# Patient Record
Sex: Female | Born: 1975
Health system: Southern US, Community
[De-identification: ages and names within clinical notes are randomized; demographics above are authoritative.]

## PROBLEM LIST (undated history)

## (undated) DIAGNOSIS — I890 Lymphedema, not elsewhere classified: Secondary | ICD-10-CM

## (undated) DIAGNOSIS — Z87442 Personal history of urinary calculi: Secondary | ICD-10-CM

## (undated) DIAGNOSIS — R112 Nausea with vomiting, unspecified: Secondary | ICD-10-CM

## (undated) DIAGNOSIS — Z9889 Other specified postprocedural states: Secondary | ICD-10-CM

## (undated) DIAGNOSIS — R519 Headache, unspecified: Secondary | ICD-10-CM

## (undated) DIAGNOSIS — R51 Headache: Secondary | ICD-10-CM

## (undated) HISTORY — PX: ESCHAROTOMY: SHX248

## (undated) HISTORY — PX: CHOLECYSTECTOMY: SHX55

## (undated) HISTORY — PX: SKIN GRAFT: SHX250

## (undated) HISTORY — PX: ABDOMINAL HYSTERECTOMY: SHX81

---

## 2004-10-21 ENCOUNTER — Ambulatory Visit: Payer: Self-pay | Admitting: Internal Medicine

## 2004-10-24 ENCOUNTER — Ambulatory Visit: Payer: Self-pay | Admitting: Internal Medicine

## 2004-10-24 ENCOUNTER — Ambulatory Visit (HOSPITAL_COMMUNITY): Admission: RE | Admit: 2004-10-24 | Discharge: 2004-10-24 | Payer: Self-pay | Admitting: Internal Medicine

## 2005-01-08 ENCOUNTER — Ambulatory Visit: Payer: Self-pay | Admitting: Cardiology

## 2006-01-26 ENCOUNTER — Ambulatory Visit: Payer: Self-pay | Admitting: Gastroenterology

## 2014-04-22 ENCOUNTER — Emergency Department (HOSPITAL_COMMUNITY)
Admission: EM | Admit: 2014-04-22 | Discharge: 2014-04-22 | Disposition: A | Discharge status: Home / Self Care | Payer: BLUE CROSS/BLUE SHIELD | Attending: Emergency Medicine | Admitting: Emergency Medicine

## 2014-04-22 ENCOUNTER — Encounter (HOSPITAL_COMMUNITY): Payer: Self-pay | Admitting: Emergency Medicine

## 2014-04-22 DIAGNOSIS — J029 Acute pharyngitis, unspecified: Secondary | ICD-10-CM

## 2014-04-22 DIAGNOSIS — R110 Nausea: Secondary | ICD-10-CM | POA: Diagnosis not present

## 2014-04-22 DIAGNOSIS — H9209 Otalgia, unspecified ear: Secondary | ICD-10-CM | POA: Insufficient documentation

## 2014-04-22 DIAGNOSIS — Z792 Long term (current) use of antibiotics: Secondary | ICD-10-CM | POA: Insufficient documentation

## 2014-04-22 DIAGNOSIS — Z880 Allergy status to penicillin: Secondary | ICD-10-CM | POA: Insufficient documentation

## 2014-04-22 DIAGNOSIS — Z8679 Personal history of other diseases of the circulatory system: Secondary | ICD-10-CM | POA: Diagnosis not present

## 2014-04-22 DIAGNOSIS — R220 Localized swelling, mass and lump, head: Secondary | ICD-10-CM | POA: Diagnosis present

## 2014-04-22 HISTORY — DX: Lymphedema, not elsewhere classified: I89.0

## 2014-04-22 LAB — CBC WITH DIFFERENTIAL/PLATELET
Basophils Absolute: 0 10*3/uL (ref 0.0–0.1)
Basophils Relative: 0 % (ref 0–1)
Eosinophils Absolute: 0 10*3/uL (ref 0.0–0.7)
Eosinophils Relative: 0 % (ref 0–5)
HEMATOCRIT: 38.7 % (ref 36.0–46.0)
HEMOGLOBIN: 12.2 g/dL (ref 12.0–15.0)
Lymphocytes Relative: 14 % (ref 12–46)
Lymphs Abs: 1.1 10*3/uL (ref 0.7–4.0)
MCH: 27.5 pg (ref 26.0–34.0)
MCHC: 31.5 g/dL (ref 30.0–36.0)
MCV: 87.2 fL (ref 78.0–100.0)
MONOS PCT: 11 % (ref 3–12)
Monocytes Absolute: 0.8 10*3/uL (ref 0.1–1.0)
NEUTROS PCT: 75 % (ref 43–77)
Neutro Abs: 5.5 10*3/uL (ref 1.7–7.7)
Platelets: 258 10*3/uL (ref 150–400)
RBC: 4.44 MIL/uL (ref 3.87–5.11)
RDW: 13.3 % (ref 11.5–15.5)
WBC: 7.4 10*3/uL (ref 4.0–10.5)

## 2014-04-22 LAB — COMPREHENSIVE METABOLIC PANEL
ALT: 23 U/L (ref 0–35)
ANION GAP: 6 (ref 5–15)
AST: 19 U/L (ref 0–37)
Albumin: 4.3 g/dL (ref 3.5–5.2)
Alkaline Phosphatase: 78 U/L (ref 39–117)
BUN: 6 mg/dL (ref 6–23)
CALCIUM: 9.5 mg/dL (ref 8.4–10.5)
CHLORIDE: 103 meq/L (ref 96–112)
CO2: 27 mmol/L (ref 19–32)
Creatinine, Ser: 0.68 mg/dL (ref 0.50–1.10)
Glucose, Bld: 112 mg/dL — ABNORMAL HIGH (ref 70–99)
Potassium: 3.8 mmol/L (ref 3.5–5.1)
Sodium: 136 mmol/L (ref 135–145)
Total Bilirubin: 0.4 mg/dL (ref 0.3–1.2)
Total Protein: 8.2 g/dL (ref 6.0–8.3)

## 2014-04-22 LAB — MONONUCLEOSIS SCREEN: Mono Screen: NEGATIVE

## 2014-04-22 LAB — RAPID STREP SCREEN (MED CTR MEBANE ONLY): Streptococcus, Group A Screen (Direct): NEGATIVE

## 2014-04-22 MED ORDER — CLINDAMYCIN HCL 150 MG PO CAPS
300.0000 mg | ORAL_CAPSULE | Freq: Once | ORAL | Status: AC
  Administered 2014-04-22: 300 mg via ORAL
  Filled 2014-04-22: qty 2

## 2014-04-22 MED ORDER — CLINDAMYCIN HCL 300 MG PO CAPS: 300.0000 mg | ORAL_CAPSULE | Freq: Four times a day (QID) | ORAL | Status: DC

## 2014-04-22 MED ORDER — ACETAMINOPHEN 160 MG/5ML PO SOLN
ORAL | Status: AC
  Administered 2014-04-22: 650 mg via ORAL
  Filled 2014-04-22: qty 20.3

## 2014-04-22 MED ORDER — ACETAMINOPHEN 160 MG/5ML PO SOLN
650.0000 mg | Freq: Once | ORAL | Status: AC
  Administered 2014-04-22: 650 mg via ORAL

## 2014-04-22 NOTE — ED Notes (Signed)
Pt states unable to swallow tablet at present.
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
## 2014-04-22 NOTE — ED Notes (Signed)
Patient with no complaints at this time. Respirations even and unlabored. Skin warm/dry. Discharge instructions reviewed with patient at this time. Patient given opportunity to voice concerns/ask questions. Patient discharged at this time and left Emergency Department with steady gait.   

## 2014-04-22 NOTE — Discharge Instructions (Signed)
Increase fluids, gargle, new antibiotic, return if worse

## 2014-04-22 NOTE — ED Notes (Signed)
Patient has lymphedema in which she has to use a pump. Per patient "last couple of times she has used pump she has gotten infection so she takes Keflex when she uses it." Per patient swelling in throat. Per patient difficult handling secretions. Patient states "I can't swallow unless I have a cough drop in." Patient reports "It feels like throat is closing." Per patient swelling x2 days. Denies any difficulty breathing.

## 2014-04-25 LAB — CULTURE, GROUP A STREP

## 2016-03-18 DIAGNOSIS — M545 Low back pain: Secondary | ICD-10-CM | POA: Diagnosis not present

## 2016-04-15 DIAGNOSIS — Z0000 Encounter for general adult medical examination without abnormal findings: Secondary | ICD-10-CM | POA: Diagnosis not present

## 2016-07-09 DIAGNOSIS — J209 Acute bronchitis, unspecified: Secondary | ICD-10-CM | POA: Diagnosis not present

## 2016-09-14 DIAGNOSIS — R635 Abnormal weight gain: Secondary | ICD-10-CM | POA: Diagnosis not present

## 2016-09-14 DIAGNOSIS — R5382 Chronic fatigue, unspecified: Secondary | ICD-10-CM | POA: Diagnosis not present

## 2016-09-25 DIAGNOSIS — L247 Irritant contact dermatitis due to plants, except food: Secondary | ICD-10-CM | POA: Diagnosis not present

## 2016-10-22 DIAGNOSIS — R1011 Right upper quadrant pain: Secondary | ICD-10-CM | POA: Diagnosis not present

## 2016-12-02 ENCOUNTER — Encounter (INDEPENDENT_AMBULATORY_CARE_PROVIDER_SITE_OTHER): Payer: Self-pay | Admitting: Internal Medicine

## 2016-12-02 ENCOUNTER — Encounter (INDEPENDENT_AMBULATORY_CARE_PROVIDER_SITE_OTHER): Payer: Self-pay

## 2016-12-22 ENCOUNTER — Encounter (INDEPENDENT_AMBULATORY_CARE_PROVIDER_SITE_OTHER): Payer: Self-pay | Admitting: Internal Medicine

## 2016-12-22 ENCOUNTER — Encounter (INDEPENDENT_AMBULATORY_CARE_PROVIDER_SITE_OTHER): Payer: Self-pay | Admitting: *Deleted

## 2016-12-22 ENCOUNTER — Ambulatory Visit (INDEPENDENT_AMBULATORY_CARE_PROVIDER_SITE_OTHER): Payer: BLUE CROSS/BLUE SHIELD | Admitting: Internal Medicine

## 2016-12-22 VITALS — BP 120/80 | HR 80 | Temp 98.6°F | Ht 65.5 in | Wt 152.5 lb

## 2016-12-22 DIAGNOSIS — K5900 Constipation, unspecified: Secondary | ICD-10-CM | POA: Diagnosis not present

## 2016-12-22 DIAGNOSIS — R1319 Other dysphagia: Secondary | ICD-10-CM

## 2016-12-22 MED ORDER — LUBIPROSTONE 8 MCG PO CAPS: 8.0000 ug | ORAL_CAPSULE | Freq: Two times a day (BID) | ORAL | 3 refills | Status: DC

## 2016-12-22 NOTE — Progress Notes (Addendum)
   Subjective:    Patient ID: Janice Ibarra, female    DOB: [birth year], 42 y.o.   MRN: [id number] PCP Dr. Quillian Quince HPI Referred by Dr. Quillian Quince for abdominal pain/nausea. She has a hx of cholecystectomy. She also c/o constipation. She has a BM x 1 a week. When she does have a BM, she has a BM all day. She has a lot of cramps with her BMs.  She takes stool softener as needed.  She stays away from stimulants, they make her nauseated. She had a colonoscopy years ago by Dr. Laural Golden. She also c/o dysphagia today. She tells me pills are lodging in her esophagus.  She also has trouble with solid foods and chases it with water.  Her appetite is good. She has weight loss which was intentional.  Rarely has acid reflux and takes Zantac as needed.  Has had a hysterectomy.   Grandmother had colon cancer. She was in her 40s       04/15/2016 H and H 12.2 and 37.0, MCV 85.albumin 4.4, total bili 0.3, ALP 71, AST 9, ALT 11. Review of Systems . Past Medical History:  Diagnosis Date  . Lymphedema     Past Surgical History:  Procedure Laterality Date  . ABDOMINAL HYSTERECTOMY    . ESCHAROTOMY      Allergies  Allergen Reactions  . Penicillins Anaphylaxis    No current outpatient prescriptions on file prior to visit.   No current facility-administered medications on file prior to visit.         Objective:   Physical Exam Blood pressure 120/80, pulse 80, temperature 98.6 F (37 C), height 5' 5.5" (1.664 m), weight 152 lb 8 oz (69.2 kg). Alert and oriented. Skin warm and dry. Oral mucosa is moist.   . Sclera anicteric, conjunctivae is pink. Thyroid not enlarged. No cervical lymphadenopathy. Lungs clear. Heart regular rate and rhythm.  Abdomen is soft. Bowel sounds are positive. No hepatomegaly. No abdominal masses felt. No tenderness.  No edema to lower extremities. Patient is alert and oriented.        Assessment & Plan:  Constipation. Am going to try her own Amitiza. Family hx of colon  cancer: Will need surveillance colonoscopy. Dysphagia: DG esophagram.

## 2016-12-22 NOTE — Patient Instructions (Signed)
Samples of Amitiza given to patient. DG esphagram

## 2017-01-04 ENCOUNTER — Ambulatory Visit (HOSPITAL_COMMUNITY)
Admission: RE | Admit: 2017-01-04 | Discharge: 2017-01-04 | Disposition: A | Discharge status: Home / Self Care | Payer: BLUE CROSS/BLUE SHIELD | Source: Ambulatory Visit | Attending: Internal Medicine | Admitting: Internal Medicine

## 2017-01-04 DIAGNOSIS — R1319 Other dysphagia: Secondary | ICD-10-CM | POA: Insufficient documentation

## 2017-01-04 DIAGNOSIS — Z9049 Acquired absence of other specified parts of digestive tract: Secondary | ICD-10-CM | POA: Diagnosis not present

## 2017-01-04 DIAGNOSIS — R1310 Dysphagia, unspecified: Secondary | ICD-10-CM | POA: Diagnosis not present

## 2017-01-04 IMAGING — RF DG ESOPHAGUS
9 series · 12 of 24 positions shown · non-contrast
Comparison: None

CLINICAL DATA: Difficulty swallowing pills, meats and breads off
and on for years, RIGHT upper quadrant pain, remote esophageal
dilatation 10 years ago, prior cholecystectomy, feels like food gets
stuck on RIGHT side the throat

EXAM:
ESOPHOGRAM / BARIUM SWALLOW / BARIUM TABLET STUDY
TECHNIQUE: Combined double contrast and single contrast examination performed
using effervescent crystals, thick barium liquid, and thin barium
liquid. The patient was observed with fluoroscopy swallowing a 13 mm
barium sulphate tablet.
FLUOROSCOPY TIME:  Fluoroscopy Time:  1 minutes 24 seconds
Radiation Exposure Index (if provided by the fluoroscopic device):
20.0 mGy
Number of Acquired Spot Images: multiple fluoroscopic screen
captures

[Series 1: cp_standard · 0.19mm/px · 1 of 63 frames shown (1 of 9)]
[frame 32/63]
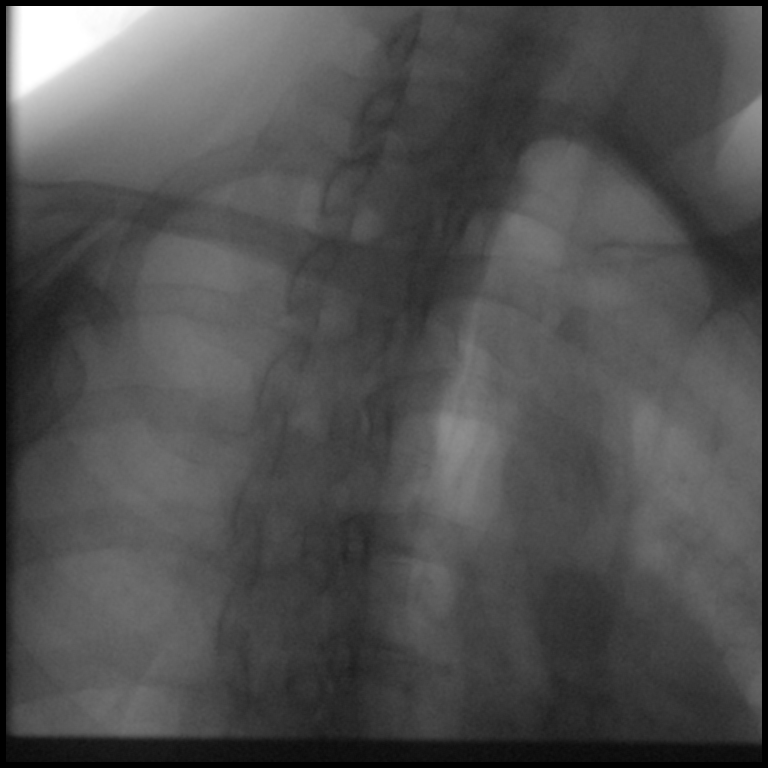

[Series 2: cp_standard · 0.19mm/px · 1 of 102 frames shown (2 of 9)]
[frame 28/102]
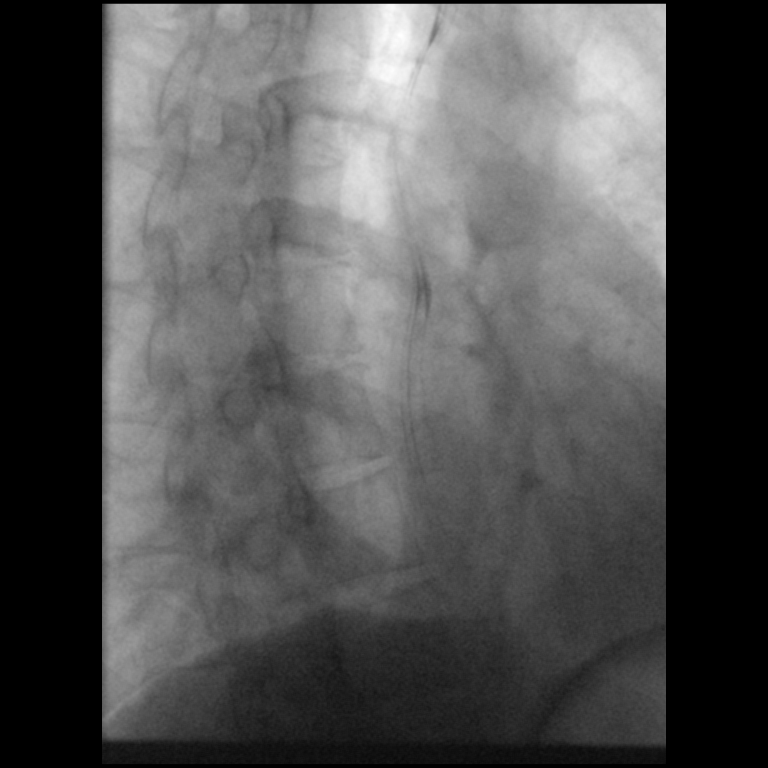

[Series 3: cp_standard · 0.19mm/px · 2 of 91 frames shown (3 of 9)]
[frame 14/91]
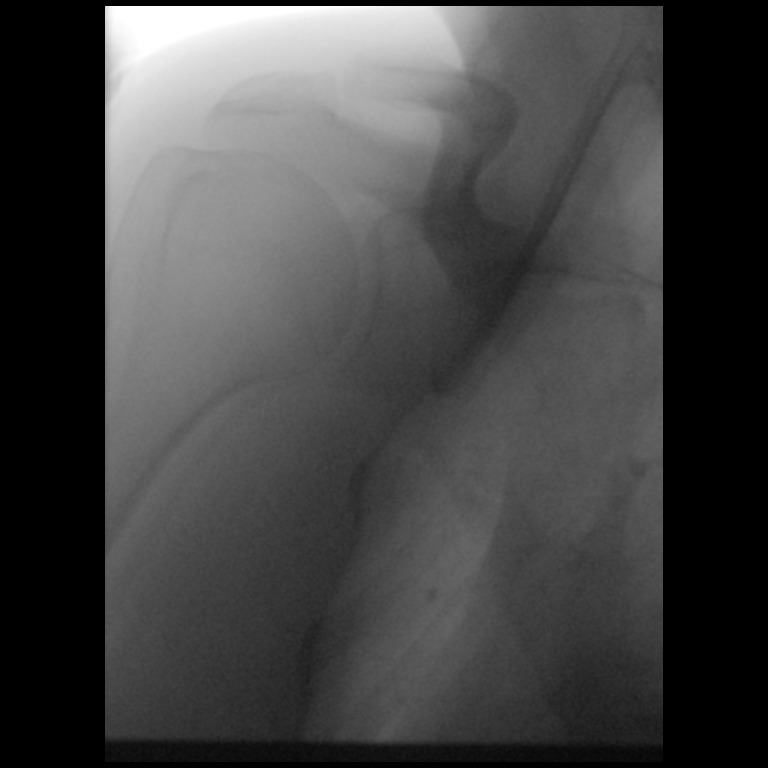
[frame 78/91]
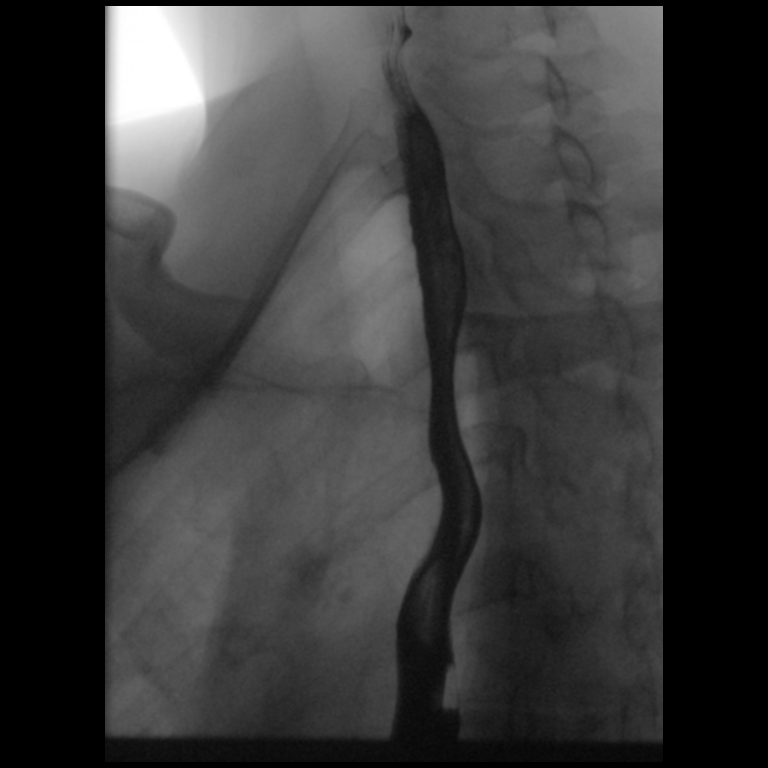

[Series 4: cp_standard · 0.19mm/px · 1 of 101 frames shown (4 of 9)]
[frame 51/101]
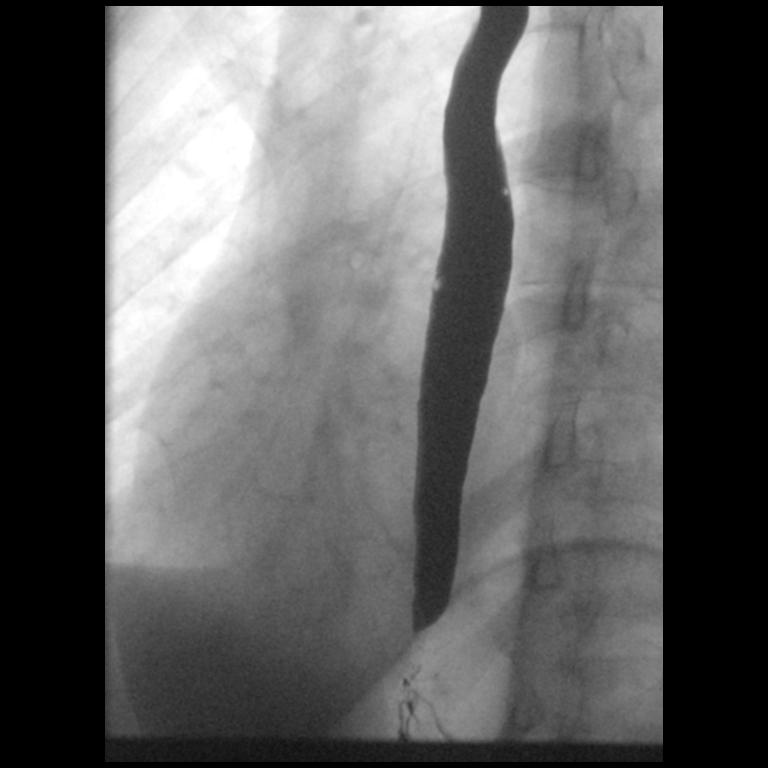

[Series 5: cp_standard · 0.29mm/px · 1 of 68 frames shown (5 of 9)]
[frame 35/68]
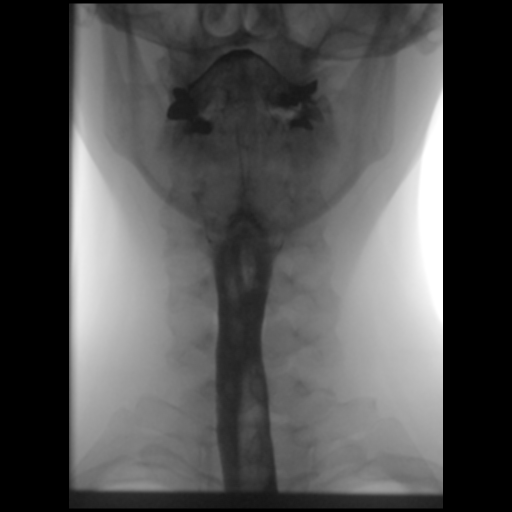

[Series 6: cp_standard · 0.27mm/px · 2 of 110 frames shown (6 of 9)]
[frame 17/110]
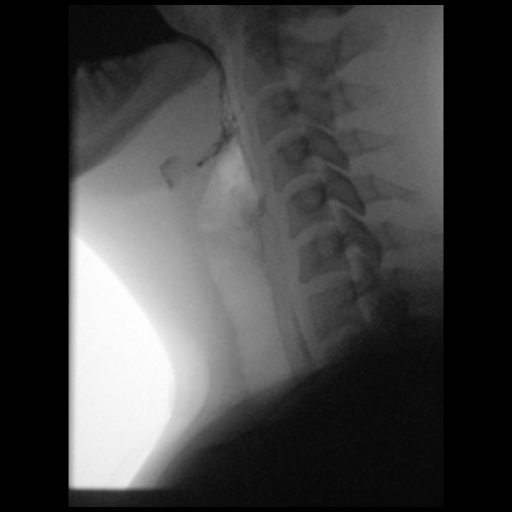
[frame 94/110]
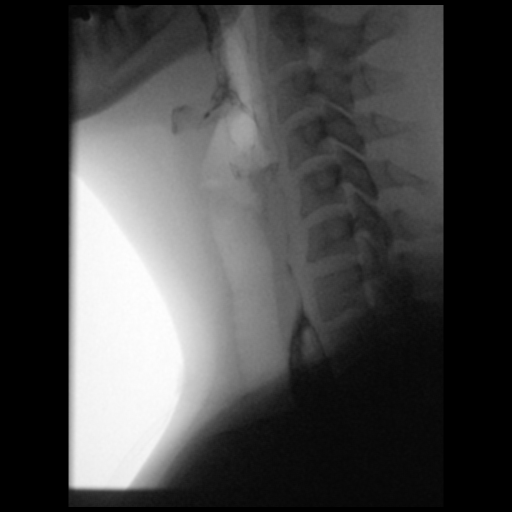

[Series 7: cp_standard · 0.28mm/px · 1 of 108 frames shown (7 of 9)]
[frame 55/108]
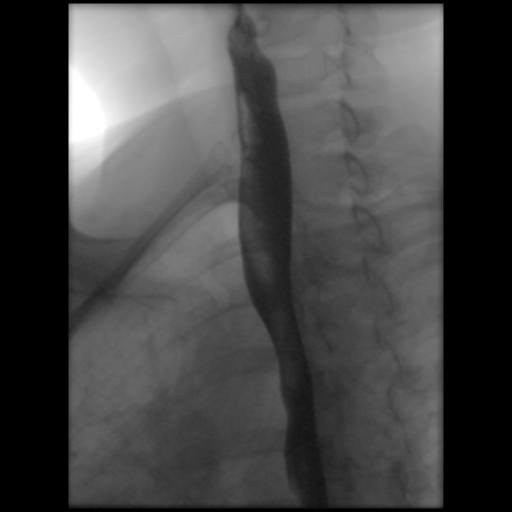

[Series 8: cp_standard · 0.28mm/px · 1 of 44 frames shown (8 of 9)]
[frame 9/44]
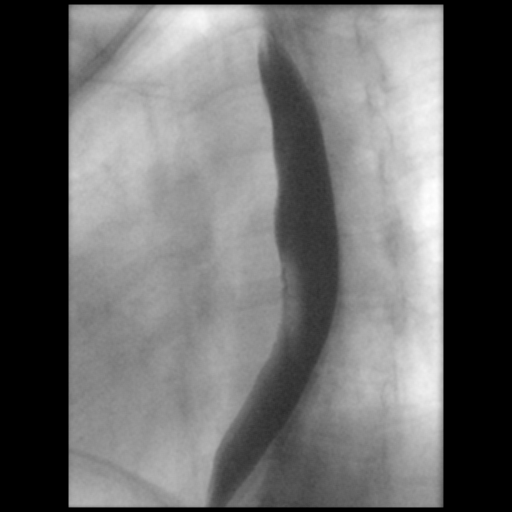

[Series 9: cp_standard · 0.28mm/px · 2 of 49 frames shown (9 of 9)]
[frame 8/49]
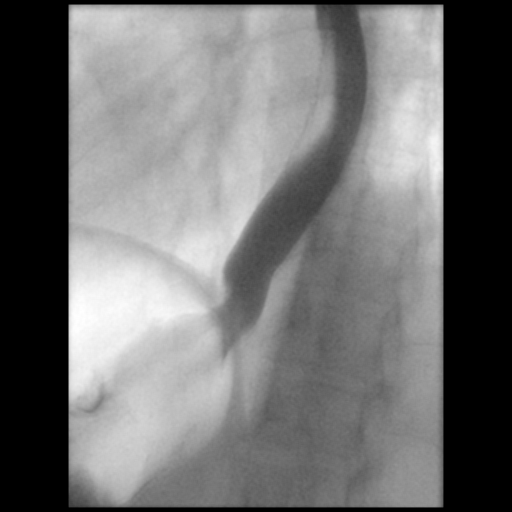
[frame 47/49]
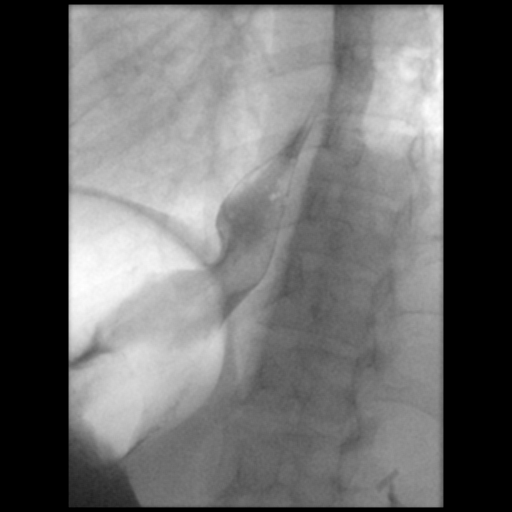

[12 of 24 positions shown; findings below may reference images not displayed]

FINDINGS: Esophageal distention: Normal distention without mass or stricture

Filling defects:  None

12.5 mm barium tablet: Passed from oral cavity to stomach without
obstruction

Motility:  Normal

Mucosa:  Smooth without irregularity or ulceration

Hypopharynx/cervical esophagus: Normal appearance. No laryngeal
penetration, aspiration or residuals.

Hiatal hernia:  Absent

GE reflux:  Not visualized during exam

Other:  N/A
IMPRESSION: Normal exam.

## 2017-01-18 ENCOUNTER — Other Ambulatory Visit (INDEPENDENT_AMBULATORY_CARE_PROVIDER_SITE_OTHER): Payer: Self-pay | Admitting: Internal Medicine

## 2017-01-18 ENCOUNTER — Telehealth (INDEPENDENT_AMBULATORY_CARE_PROVIDER_SITE_OTHER): Payer: Self-pay | Admitting: Internal Medicine

## 2017-01-18 DIAGNOSIS — R1310 Dysphagia, unspecified: Secondary | ICD-10-CM

## 2017-01-18 DIAGNOSIS — Z800 Family history of malignant neoplasm of digestive organs: Secondary | ICD-10-CM

## 2017-01-18 DIAGNOSIS — R1319 Other dysphagia: Secondary | ICD-10-CM

## 2017-01-18 NOTE — Telephone Encounter (Signed)
Ann, EGD/ED, colonoscopy

## 2017-01-19 DIAGNOSIS — R1310 Dysphagia, unspecified: Secondary | ICD-10-CM | POA: Insufficient documentation

## 2017-01-19 DIAGNOSIS — R1319 Other dysphagia: Secondary | ICD-10-CM | POA: Insufficient documentation

## 2017-01-19 DIAGNOSIS — Z800 Family history of malignant neoplasm of digestive organs: Secondary | ICD-10-CM | POA: Insufficient documentation

## 2017-01-19 NOTE — Telephone Encounter (Signed)
TCS/EGD/ED sch'd 02/19/17, patient aware, instructions mailed

## 2017-01-20 ENCOUNTER — Telehealth (INDEPENDENT_AMBULATORY_CARE_PROVIDER_SITE_OTHER): Payer: Self-pay | Admitting: *Deleted

## 2017-01-20 ENCOUNTER — Encounter (INDEPENDENT_AMBULATORY_CARE_PROVIDER_SITE_OTHER): Payer: Self-pay | Admitting: *Deleted

## 2017-01-20 MED ORDER — PEG 3350-KCL-NA BICARB-NACL 420 G PO SOLR: 4000.0000 mL | Freq: Once | ORAL | 0 refills | Status: AC

## 2017-01-20 NOTE — Telephone Encounter (Signed)
Patient needs trilyte 

## 2017-02-19 ENCOUNTER — Other Ambulatory Visit: Payer: Self-pay

## 2017-02-19 ENCOUNTER — Encounter (HOSPITAL_COMMUNITY)
Admission: RE | Disposition: A | Discharge status: Home / Self Care | Payer: Self-pay | Source: Ambulatory Visit | Attending: Internal Medicine

## 2017-02-19 ENCOUNTER — Encounter (HOSPITAL_COMMUNITY): Payer: Self-pay | Admitting: *Deleted

## 2017-02-19 ENCOUNTER — Ambulatory Visit (HOSPITAL_COMMUNITY)
Admission: RE | Admit: 2017-02-19 | Discharge: 2017-02-19 | Disposition: A | Discharge status: Home / Self Care | Payer: BLUE CROSS/BLUE SHIELD | Source: Ambulatory Visit | Attending: Internal Medicine | Admitting: Internal Medicine

## 2017-02-19 DIAGNOSIS — Z881 Allergy status to other antibiotic agents status: Secondary | ICD-10-CM | POA: Insufficient documentation

## 2017-02-19 DIAGNOSIS — K644 Residual hemorrhoidal skin tags: Secondary | ICD-10-CM | POA: Insufficient documentation

## 2017-02-19 DIAGNOSIS — Z880 Allergy status to penicillin: Secondary | ICD-10-CM | POA: Diagnosis not present

## 2017-02-19 DIAGNOSIS — K633 Ulcer of intestine: Secondary | ICD-10-CM | POA: Diagnosis not present

## 2017-02-19 DIAGNOSIS — K219 Gastro-esophageal reflux disease without esophagitis: Secondary | ICD-10-CM | POA: Insufficient documentation

## 2017-02-19 DIAGNOSIS — Z87891 Personal history of nicotine dependence: Secondary | ICD-10-CM | POA: Insufficient documentation

## 2017-02-19 DIAGNOSIS — Z800 Family history of malignant neoplasm of digestive organs: Secondary | ICD-10-CM | POA: Insufficient documentation

## 2017-02-19 DIAGNOSIS — R1031 Right lower quadrant pain: Secondary | ICD-10-CM | POA: Diagnosis not present

## 2017-02-19 DIAGNOSIS — Z791 Long term (current) use of non-steroidal anti-inflammatories (NSAID): Secondary | ICD-10-CM | POA: Insufficient documentation

## 2017-02-19 DIAGNOSIS — K5900 Constipation, unspecified: Secondary | ICD-10-CM | POA: Insufficient documentation

## 2017-02-19 DIAGNOSIS — Z79899 Other long term (current) drug therapy: Secondary | ICD-10-CM | POA: Insufficient documentation

## 2017-02-19 DIAGNOSIS — R1310 Dysphagia, unspecified: Secondary | ICD-10-CM | POA: Insufficient documentation

## 2017-02-19 DIAGNOSIS — R1314 Dysphagia, pharyngoesophageal phase: Secondary | ICD-10-CM | POA: Diagnosis not present

## 2017-02-19 DIAGNOSIS — K228 Other specified diseases of esophagus: Secondary | ICD-10-CM | POA: Diagnosis not present

## 2017-02-19 DIAGNOSIS — R1319 Other dysphagia: Secondary | ICD-10-CM | POA: Insufficient documentation

## 2017-02-19 HISTORY — DX: Headache, unspecified: R51.9

## 2017-02-19 HISTORY — PX: ESOPHAGEAL DILATION: SHX303

## 2017-02-19 HISTORY — DX: Headache: R51

## 2017-02-19 HISTORY — DX: Other specified postprocedural states: Z98.890

## 2017-02-19 HISTORY — PX: COLONOSCOPY: SHX5424

## 2017-02-19 HISTORY — DX: Personal history of urinary calculi: Z87.442

## 2017-02-19 HISTORY — PX: ESOPHAGOGASTRODUODENOSCOPY: SHX5428

## 2017-02-19 HISTORY — DX: Nausea with vomiting, unspecified: R11.2

## 2017-02-19 SURGERY — COLONOSCOPY
Anesthesia: Moderate Sedation

## 2017-02-19 MED ORDER — SODIUM CHLORIDE 0.9 % IV SOLN: INTRAVENOUS | Status: DC

## 2017-02-19 MED ORDER — SIMETHICONE 40 MG/0.6ML PO SUSP
ORAL | Status: AC
  Filled 2017-02-19: qty 30

## 2017-02-19 MED ORDER — MEPERIDINE HCL 50 MG/ML IJ SOLN
INTRAMUSCULAR | Status: AC
  Filled 2017-02-19: qty 1

## 2017-02-19 MED ORDER — LIDOCAINE VISCOUS 2 % MT SOLN
OROMUCOSAL | Status: DC | PRN
  Administered 2017-02-19: 4 mL via OROMUCOSAL

## 2017-02-19 MED ORDER — MIDAZOLAM HCL 5 MG/5ML IJ SOLN
INTRAMUSCULAR | Status: AC
  Filled 2017-02-19: qty 10

## 2017-02-19 MED ORDER — MIDAZOLAM HCL 5 MG/5ML IJ SOLN
INTRAMUSCULAR | Status: DC | PRN
  Administered 2017-02-19: 1 mg via INTRAVENOUS
  Administered 2017-02-19 (×2): 2 mg via INTRAVENOUS
  Administered 2017-02-19: 1 mg via INTRAVENOUS
  Administered 2017-02-19 (×2): 2 mg via INTRAVENOUS

## 2017-02-19 MED ORDER — STERILE WATER FOR IRRIGATION IR SOLN
Status: DC | PRN
  Administered 2017-02-19: 100 mL

## 2017-02-19 MED ORDER — LIDOCAINE VISCOUS 2 % MT SOLN
OROMUCOSAL | Status: AC
  Filled 2017-02-19: qty 15

## 2017-02-19 MED ORDER — MEPERIDINE HCL 50 MG/ML IJ SOLN
INTRAMUSCULAR | Status: DC | PRN
  Administered 2017-02-19 (×4): 25 mg via INTRAVENOUS

## 2017-02-19 NOTE — H&P (Signed)
Janice Ibarra is an 41 y.o. female.   Chief Complaint: Patient is here for EGD, EGD and colonoscopy. HPI: Patient is a 41 year old Caucasian female who was chronic GERD.  She is able to control her symptoms with diet and low-dose ranitidine.  She has been having intermittent solid food dysphagia for last 1 year.  She has difficulty with meats and breads and lately she is a difficulty swallowing pills.  She points to the suprasternal area site of bolus obstruction.  She also complains of constipation and right-sided abdominal pain which at times is intense.  Pain occurs when she is constipated.  She is doing some better with Amitiza.  She denies melena or rectal bleeding.  She has lost 27 pounds this year which is involuntary weight loss. History significant for CRC in maternal grandmother who was diagnosed CRC in her late 67s and had another primary 13 or 12 years later and now has terminal disease.  Past Medical History:  Diagnosis Date  . Headache   . History of kidney stones   . Lymphedema   . PONV (postoperative nausea and vomiting)        GERD  Past Surgical History:  Procedure Laterality Date  . ABDOMINAL HYSTERECTOMY    . CHOLECYSTECTOMY    . ESCHAROTOMY    . SKIN GRAFT Right    leg and arm    Family History  Problem Relation Age of Onset  . Diabetes Mother    Social History:  reports that she has quit smoking. she has never used smokeless tobacco. She reports that she does not drink alcohol or use drugs.  Allergies:  Allergies  Allergen Reactions  . Penicillins Anaphylaxis  . Erythromycin Other (See Comments)    Pt states this was a childhood allergy but has taken other MYCIN drugs since    Medications Prior to Admission  Medication Sig Dispense Refill  . ibuprofen (ADVIL,MOTRIN) 200 MG tablet Take 200 mg every 8 (eight) hours as needed by mouth for headache.    . lubiprostone (AMITIZA) 8 MCG capsule Take 1 capsule (8 mcg total) by mouth 2 (two) times daily with a  meal. 60 capsule 3  . ranitidine (ZANTAC) 150 MG capsule Take 150 mg by mouth as needed for heartburn.      No results found for this or any previous visit (from the past 48 hour(s)). No results found.  ROS  Blood pressure 103/72, pulse 80, temperature 98.4 F (36.9 C), temperature source Oral, resp. rate 12, height 5\' 5"  (1.651 m), weight 150 lb (68 kg), SpO2 100 %. Physical Exam  Constitutional: She appears well-developed and well-nourished.  HENT:  Mouth/Throat: Oropharynx is clear and moist.  Eyes: Conjunctivae are normal. No scleral icterus.  Neck: No thyromegaly present.  Cardiovascular: Normal rate, regular rhythm, normal heart sounds and intact distal pulses.  No murmur heard. Respiratory: Effort normal and breath sounds normal.  GI:  Abdomen is symmetrical and soft with mild tenderness epigastrium.  No organomegaly or masses.  Musculoskeletal: She exhibits no edema.  Lymphadenopathy:    She has no cervical adenopathy.  Neurological: She is alert.  Skin: Skin is warm and dry.     Assessment/Plan Solid food dysphagia. Right-sided abdominal pain and constipation. EGD with ED and diagnostic colonoscopy.  Hildred Laser, MD 02/19/2017, 8:41 AM

## 2017-02-19 NOTE — Op Note (Signed)
[facility] Patient Name: Janice Ibarra Procedure Date: 02/19/2017 8:10 AM MRN: [id number] Date of Birth: 04-06-1976 Attending MD: Hildred Laser , MD CSN: [id number] Age: 41 Admit Type: Outpatient Procedure:                Upper GI endoscopy Indications:              Esophageal dysphagia, Gastro-esophageal reflux                            disease Providers:                Hildred Laser, MD, Rosina Lowenstein, RN, Randa Spike, Technician Referring MD:             Manon Hilding MD, MD Medicines:                Lidocaine spray, Meperidine 50 mg IV, Midazolam 10                            mg IV Complications:            No immediate complications. Estimated Blood Loss:     Estimated blood loss: none. Procedure:                Pre-Anesthesia Assessment:                           - Prior to the procedure, a History and Physical                            was performed, and patient medications and                            allergies were reviewed. The patient's tolerance of                            previous anesthesia was also reviewed. The risks                            and benefits of the procedure and the sedation                            options and risks were discussed with the patient.                            All questions were answered, and informed consent                            was obtained. Prior Anticoagulants: The patient                            last took ibuprofen 5 days prior to the procedure.  ASA Grade Assessment: I - A normal, healthy                            patient. After reviewing the risks and benefits,                            the patient was deemed in satisfactory condition to                            undergo the procedure.                           After obtaining informed consent, the endoscope was                            passed under direct vision. Throughout the          procedure, the patient's blood pressure, pulse, and                            oxygen saturations were monitored continuously. The                            [id number]-[id number] ([id number]) scope was introduced through the                            mouth, and advanced to the second part of duodenum.                            The upper GI endoscopy was accomplished without                            difficulty. The patient tolerated the procedure                            well. Scope In: 8:55:33 AM Scope Out: 9:06:09 AM Total Procedure Duration: 0 hours 10 minutes 36 seconds  Findings:      The examined esophagus was normal.      The Z-line was irregular and was found 36 cm from the incisors.      No endoscopic abnormality was evident in the esophagus to explain the       patient's complaint of dysphagia. It was decided, however, to proceed       with dilation of the entire esophagus. The scope was withdrawn. Dilation       was performed with a Maloney dilator with no resistance at 4 Fr. The       dilation site was examined following endoscope reinsertion and showed no       change and no bleeding, mucosal tear or perforation.      The entire examined stomach was normal.      The duodenal bulb and second portion of the duodenum were normal. Impression:               - Normal esophagus.                           -  Z-line irregular, 36 cm from the incisors.                           - No endoscopic esophageal abnormality to explain                            patient's dysphagia. Esophagus dilated. Dilated.                           - Normal stomach.                           - Normal duodenal bulb and second portion of the                            duodenum.                           - No specimens collected. Moderate Sedation:      Moderate (conscious) sedation was administered by the endoscopy nurse       and supervised by the endoscopist. The following parameters were       monitored:  oxygen saturation, heart rate, blood pressure, CO2       capnography and response to care. Total physician intraservice time was       19 minutes. Recommendation:           - Patient has a contact number available for                            emergencies. The signs and symptoms of potential                            delayed complications were discussed with the                            patient. Return to normal activities tomorrow.                            Written discharge instructions were provided to the                            patient.                           - Resume previous diet today.                           - Continue present medications.                           - Telephone call to office in one week. Procedure Code(s):        --- Professional ---                           [phone], Esophagogastroduodenoscopy, flexible,  transoral; diagnostic, including collection of                            specimen(s) by brushing or washing, when performed                            (separate procedure)                           43450, Dilation of esophagus, by unguided sound or                            bougie, single or multiple passes                           99152, Moderate sedation services provided by the                            same physician or other qualified health care                            professional performing the diagnostic or                            therapeutic service that the sedation supports,                            requiring the presence of an independent trained                            observer to assist in the monitoring of the                            patient's level of consciousness and physiological                            status; initial 15 minutes of intraservice time,                            patient age 66 years or older Diagnosis Code(s):        --- Professional ---                            K22.8, Other specified diseases of esophagus                           R13.14, Dysphagia, pharyngoesophageal phase                           K21.9, Gastro-esophageal reflux disease without                            esophagitis CPT copyright 2016 American [employer] Association. All rights reserved. The codes documented in this report are preliminary and upon coder review may  be revised to meet current compliance requirements. [employer]  Laural Golden, MD Hildred Laser, MD 02/19/2017 9:50:56 AM This report has been signed electronically. Number of Addenda: 0

## 2017-02-19 NOTE — Discharge Instructions (Signed)
Resume usual medications as before. High fiber diet. Can use Dulcolax suppository on as-needed basis. No driving for 24 hours. Patient will call with biopsy results.   Colonoscopy, Adult, Care After This sheet gives you information about how to care for yourself after your procedure. Your health care provider may also give you more specific instructions. If you have problems or questions, contact your health care provider. What can I expect after the procedure? After the procedure, it is common to have:  A small amount of blood in your stool for 24 hours after the procedure.  Some gas.  Mild abdominal cramping or bloating.  Follow these instructions at home: General instructions   For the first 24 hours after the procedure: ? Do not drive or use machinery. ? Do not sign important documents. ? Do not drink alcohol. ? Do your regular daily activities at a slower pace than normal. ? Eat soft, easy-to-digest foods. ? Rest often.  Take over-the-counter or prescription medicines only as told by your health care provider.  It is up to you to get the results of your procedure. Ask your health care provider, or the department performing the procedure, when your results will be ready. Relieving cramping and bloating  Try walking around when you have cramps or feel bloated.  Apply heat to your abdomen as told by your health care provider. Use a heat source that your health care provider recommends, such as a moist heat pack or a heating pad. ? Place a towel between your skin and the heat source. ? Leave the heat on for 20-30 minutes. ? Remove the heat if your skin turns bright red. This is especially important if you are unable to feel pain, heat, or cold. You may have a greater risk of getting burned. Eating and drinking  Drink enough fluid to keep your urine clear or pale yellow.  Resume your normal diet as instructed by your health care provider. Avoid heavy or fried foods that  are hard to digest.  Avoid drinking alcohol for as long as instructed by your health care provider. Contact a health care provider if:  You have blood in your stool 2-3 days after the procedure. Get help right away if:  You have more than a small spotting of blood in your stool.  You pass large blood clots in your stool.  Your abdomen is swollen.  You have nausea or vomiting.  You have a fever.  You have increasing abdominal pain that is not relieved with medicine. This information is not intended to replace advice given to you by your health care provider. Make sure you discuss any questions you have with your health care provider. Document Released: 11/12/2003 Document Revised: 12/23/2015 Document Reviewed: 06/11/2015 Elsevier Interactive Patient Education  2018 [employer].  Esophagogastroduodenoscopy, Care After Refer to this sheet in the next few weeks. These instructions provide you with information about caring for yourself after your procedure. Your health care provider may also give you more specific instructions. Your treatment has been planned according to current medical practices, but problems sometimes occur. Call your health care provider if you have any problems or questions after your procedure. What can I expect after the procedure? After the procedure, it is common to have:  A sore throat.  Nausea.  Bloating.  Dizziness.  Fatigue.  Follow these instructions at home:  Do not eat or drink anything until the numbing medicine (local anesthetic) has worn off and your gag reflex has returned. You  will know that the local anesthetic has worn off when you can swallow comfortably.  Do not drive for 24 hours if you received a medicine to help you relax (sedative).  If your health care provider took a tissue sample for testing during the procedure, make sure to get your test results. This is your responsibility. Ask your health care provider or the department  performing the test when your results will be ready.  Keep all follow-up visits as told by your health care provider. This is important. Contact a health care provider if:  You cannot stop coughing.  You are not urinating.  You are urinating less than usual. Get help right away if:  You have trouble swallowing.  You cannot eat or drink.  You have throat or chest pain that gets worse.  You are dizzy or light-headed.  You faint.  You have nausea or vomiting.  You have chills.  You have a fever.  You have severe abdominal pain.  You have black, tarry, or bloody stools. This information is not intended to replace advice given to you by your health care provider. Make sure you discuss any questions you have with your health care provider. Document Released: 03/16/2012 Document Revised: 09/05/2015 Document Reviewed: 02/21/2015 Elsevier Interactive Patient Education  2018 [place].   High-Fiber Diet Fiber, also called dietary fiber, is a type of carbohydrate found in fruits, vegetables, whole grains, and beans. A high-fiber diet can have many health benefits. Your health care provider may recommend a high-fiber diet to help:  Prevent constipation. Fiber can make your bowel movements more regular.  Lower your cholesterol.  Relieve hemorrhoids, uncomplicated diverticulosis, or irritable bowel syndrome.  Prevent overeating as part of a weight-loss plan.  Prevent heart disease, type 2 diabetes, and certain cancers.  What is my plan? The recommended daily intake of fiber includes:  38 grams for men under age 40.  16 grams for men over age 3.  75 grams for women under age 82.  48 grams for women over age 78.  You can get the recommended daily intake of dietary fiber by eating a variety of fruits, vegetables, grains, and beans. Your health care provider may also recommend a fiber supplement if it is not possible to get enough fiber through your diet. What do I  need to know about a high-fiber diet?  Fiber supplements have not been widely studied for their effectiveness, so it is better to get fiber through food sources.  Always check the fiber content on thenutrition facts label of any prepackaged food. Look for foods that contain at least 5 grams of fiber per serving.  Ask your dietitian if you have questions about specific foods that are related to your condition, especially if those foods are not listed in the following section.  Increase your daily fiber consumption gradually. Increasing your intake of dietary fiber too quickly may cause bloating, cramping, or gas.  Drink plenty of water. Water helps you to digest fiber. What foods can I eat? Grains Whole-grain breads. Multigrain cereal. Oats and oatmeal. Brown rice. Barley. Bulgur wheat. War. Bran muffins. Popcorn. Rye wafer crackers. Vegetables Sweet potatoes. Spinach. Kale. Artichokes. Cabbage. Broccoli. Green peas. Carrots. Squash. Fruits Berries. Pears. Apples. Oranges. Avocados. Prunes and raisins. Dried figs. Meats and Other Protein Sources Navy, kidney, pinto, and soy beans. Split peas. Lentils. Nuts and seeds. Dairy Fiber-fortified yogurt. Beverages Fiber-fortified soy milk. Fiber-fortified orange juice. Other Fiber bars. The items listed above may not be a complete  list of recommended foods or beverages. Contact your dietitian for more options. What foods are not recommended? Grains White bread. Pasta made with refined flour. White rice. Vegetables Fried potatoes. Canned vegetables. Well-cooked vegetables. Fruits Fruit juice. Cooked, strained fruit. Meats and Other Protein Sources Fatty cuts of meat. Fried Sales executive or fried fish. Dairy Milk. Yogurt. Cream cheese. Sour cream. Beverages Soft drinks. Other Cakes and pastries. Butter and oils. The items listed above may not be a complete list of foods and beverages to avoid. Contact your dietitian for more  information. What are some tips for including high-fiber foods in my diet?  Eat a wide variety of high-fiber foods.  Make sure that half of all grains consumed each day are whole grains.  Replace breads and cereals made from refined flour or white flour with whole-grain breads and cereals.  Replace white rice with brown rice, bulgur wheat, or millet.  Start the day with a breakfast that is high in fiber, such as a cereal that contains at least 5 grams of fiber per serving.  Use beans in place of meat in soups, salads, or pasta.  Eat high-fiber snacks, such as berries, raw vegetables, nuts, or popcorn. This information is not intended to replace advice given to you by your health care provider. Make sure you discuss any questions you have with your health care provider. Document Released: 03/30/2005 Document Revised: 09/05/2015 Document Reviewed: 09/12/2013 Elsevier Interactive Patient Education  2017 [employer].

## 2017-02-19 NOTE — Op Note (Signed)
[facility] Patient Name: Janice Ibarra Procedure Date: 02/19/2017 9:08 AM MRN: [id number] Date of Birth: [birth year] Attending MD: Hildred Laser , MD CSN: [id number] Age: 41 Admit Type: Outpatient Procedure:                Colonoscopy Indications:              Abdominal pain in the right lower quadrant,                            Constipation Providers:                Hildred Laser, MD, Rosina Lowenstein, RN, Randa Spike, Technician Referring MD:             Manon Hilding, MD Medicines:                Meperidine 50 mg IV Complications:            No immediate complications. Estimated Blood Loss:     Estimated blood loss was minimal. Procedure:                Pre-Anesthesia Assessment:                           - Prior to the procedure, a History and Physical                            was performed, and patient medications and                            allergies were reviewed. The patient's tolerance of                            previous anesthesia was also reviewed. The risks                            and benefits of the procedure and the sedation                            options and risks were discussed with the patient.                            All questions were answered, and informed consent                            was obtained. Prior Anticoagulants: The patient                            last took ibuprofen 5 days prior to the procedure.                            ASA Grade Assessment: I - A normal, healthy  patient. After reviewing the risks and benefits,                            the patient was deemed in satisfactory condition to                            undergo the procedure.                           After obtaining informed consent, the colonoscope                            was passed under direct vision. Throughout the                            procedure, the patient's blood pressure, pulse, and                             oxygen saturations were monitored continuously. The                            [id number]) was introduced through the anus                            and advanced to the the terminal ileum, with                            identification of the appendiceal orifice and IC                            valve. The colonoscopy was somewhat difficult due                            to restricted mobility of the colon. The patient                            tolerated the procedure well. The quality of the                            bowel preparation was good. The terminal ileum,                            ileocecal valve, appendiceal orifice, and rectum                            were photographed. Scope In: 9:11:09 AM Scope Out: 9:35:59 AM Scope Withdrawal Time: 0 hours 13 minutes 3 seconds  Total Procedure Duration: 0 hours 24 minutes 50 seconds  Findings:      The perianal and digital rectal examinations were normal.      The terminal ileum appeared normal.      Discontinuous areas of nonbleeding ulcerated mucosa with no stigmata of       recent bleeding were present at the splenic flexure. Biopsies were taken       with a  cold forceps for histology.      External hemorrhoids were found during retroflexion. The hemorrhoids       were small. Impression:               - The examined portion of the ileum was normal.                           - Four small superficial ulcers at splenic flexure.                            Biopsied.                           - External hemorrhoids.                           Comment: These ulcers appear to be healing. Moderate Sedation:      Moderate (conscious) sedation was administered by the endoscopy nurse       and supervised by the endoscopist. The following parameters were       monitored: oxygen saturation, heart rate, blood pressure, CO2       capnography and response to care. Total physician intraservice time was       29  minutes. Recommendation:           - Patient has a contact number available for                            emergencies. The signs and symptoms of potential                            delayed complications were discussed with the                            patient. Return to normal activities tomorrow.                            Written discharge instructions were provided to the                            patient.                           - High fiber diet today.                           - Continue present medications.                           - Await pathology results.                           - Repeat colonoscopy in 10 years for screening                            purposes. Procedure Code(s):        --- Professional ---  37902, Colonoscopy, flexible; with biopsy, single                            or multiple                           99152, Moderate sedation services provided by the                            same physician or other qualified health care                            professional performing the diagnostic or                            therapeutic service that the sedation supports,                            requiring the presence of an independent trained                            observer to assist in the monitoring of the                            patient's level of consciousness and physiological                            status; initial 15 minutes of intraservice time,                            patient age 20 years or older                           [phone], Moderate sedation services; each additional                            15 minutes intraservice time Diagnosis Code(s):        --- Professional ---                           K64.4, Residual hemorrhoidal skin tags                           K63.3, Ulcer of intestine                           R10.31, Right lower quadrant pain                           K59.00, Constipation,  unspecified CPT copyright 2016 [employer]. All rights reserved. The codes documented in this report are preliminary and upon coder review may  be revised to meet current compliance requirements. Hildred Laser, MD Hildred Laser, MD 02/19/2017 9:56:22 AM This report has been signed electronically. Number of Addenda: 0

## 2017-02-23 ENCOUNTER — Encounter (HOSPITAL_COMMUNITY): Payer: Self-pay | Admitting: Internal Medicine

## 2017-02-25 DIAGNOSIS — M549 Dorsalgia, unspecified: Secondary | ICD-10-CM | POA: Diagnosis not present

## 2017-06-03 DIAGNOSIS — J069 Acute upper respiratory infection, unspecified: Secondary | ICD-10-CM | POA: Diagnosis not present

## 2017-11-22 DIAGNOSIS — H6503 Acute serous otitis media, bilateral: Secondary | ICD-10-CM | POA: Diagnosis not present

## 2018-01-18 DIAGNOSIS — Z6825 Body mass index (BMI) 25.0-25.9, adult: Secondary | ICD-10-CM | POA: Diagnosis not present

## 2018-01-18 DIAGNOSIS — R300 Dysuria: Secondary | ICD-10-CM | POA: Diagnosis not present

## 2018-02-28 DIAGNOSIS — Z0000 Encounter for general adult medical examination without abnormal findings: Secondary | ICD-10-CM | POA: Diagnosis not present

## 2018-02-28 DIAGNOSIS — R251 Tremor, unspecified: Secondary | ICD-10-CM | POA: Diagnosis not present

## 2018-02-28 DIAGNOSIS — R5383 Other fatigue: Secondary | ICD-10-CM | POA: Diagnosis not present

## 2018-02-28 DIAGNOSIS — M791 Myalgia, unspecified site: Secondary | ICD-10-CM | POA: Diagnosis not present

## 2018-04-25 DIAGNOSIS — G43909 Migraine, unspecified, not intractable, without status migrainosus: Secondary | ICD-10-CM | POA: Diagnosis not present

## 2018-04-25 DIAGNOSIS — Z6826 Body mass index (BMI) 26.0-26.9, adult: Secondary | ICD-10-CM | POA: Diagnosis not present

## 2018-05-25 DIAGNOSIS — Z23 Encounter for immunization: Secondary | ICD-10-CM | POA: Diagnosis not present

## 2018-07-11 DIAGNOSIS — R079 Chest pain, unspecified: Secondary | ICD-10-CM | POA: Diagnosis not present

## 2018-07-11 DIAGNOSIS — F419 Anxiety disorder, unspecified: Secondary | ICD-10-CM | POA: Diagnosis not present

## 2018-09-28 DIAGNOSIS — H6690 Otitis media, unspecified, unspecified ear: Secondary | ICD-10-CM | POA: Diagnosis not present

## 2018-09-28 DIAGNOSIS — R079 Chest pain, unspecified: Secondary | ICD-10-CM | POA: Diagnosis not present

## 2018-09-28 DIAGNOSIS — H9319 Tinnitus, unspecified ear: Secondary | ICD-10-CM | POA: Diagnosis not present

## 2018-09-28 DIAGNOSIS — R0602 Shortness of breath: Secondary | ICD-10-CM | POA: Diagnosis not present

## 2018-10-06 ENCOUNTER — Ambulatory Visit (INDEPENDENT_AMBULATORY_CARE_PROVIDER_SITE_OTHER): Payer: BC Managed Care – PPO | Admitting: Otolaryngology

## 2018-10-06 DIAGNOSIS — H9209 Otalgia, unspecified ear: Secondary | ICD-10-CM

## 2018-10-06 DIAGNOSIS — H903 Sensorineural hearing loss, bilateral: Secondary | ICD-10-CM | POA: Diagnosis not present

## 2018-10-28 DIAGNOSIS — M545 Low back pain: Secondary | ICD-10-CM | POA: Diagnosis not present

## 2018-10-28 DIAGNOSIS — R112 Nausea with vomiting, unspecified: Secondary | ICD-10-CM | POA: Diagnosis not present

## 2018-10-28 DIAGNOSIS — G43919 Migraine, unspecified, intractable, without status migrainosus: Secondary | ICD-10-CM | POA: Diagnosis not present

## 2018-10-28 DIAGNOSIS — R1011 Right upper quadrant pain: Secondary | ICD-10-CM | POA: Diagnosis not present

## 2019-01-06 DIAGNOSIS — N3000 Acute cystitis without hematuria: Secondary | ICD-10-CM | POA: Diagnosis not present

## 2019-01-21 DIAGNOSIS — Z20828 Contact with and (suspected) exposure to other viral communicable diseases: Secondary | ICD-10-CM | POA: Diagnosis not present

## 2019-03-02 DIAGNOSIS — K5900 Constipation, unspecified: Secondary | ICD-10-CM | POA: Diagnosis not present

## 2019-03-02 DIAGNOSIS — R109 Unspecified abdominal pain: Secondary | ICD-10-CM | POA: Diagnosis not present

## 2019-04-21 DIAGNOSIS — Z23 Encounter for immunization: Secondary | ICD-10-CM | POA: Diagnosis not present

## 2019-05-19 DIAGNOSIS — Z23 Encounter for immunization: Secondary | ICD-10-CM | POA: Diagnosis not present

## 2019-07-04 DIAGNOSIS — G43909 Migraine, unspecified, not intractable, without status migrainosus: Secondary | ICD-10-CM | POA: Diagnosis not present

## 2019-07-12 DIAGNOSIS — Z0000 Encounter for general adult medical examination without abnormal findings: Secondary | ICD-10-CM | POA: Diagnosis not present

## 2019-08-02 DIAGNOSIS — J069 Acute upper respiratory infection, unspecified: Secondary | ICD-10-CM | POA: Diagnosis not present

## 2019-08-02 DIAGNOSIS — J028 Acute pharyngitis due to other specified organisms: Secondary | ICD-10-CM | POA: Diagnosis not present

## 2019-08-28 DIAGNOSIS — M5430 Sciatica, unspecified side: Secondary | ICD-10-CM | POA: Diagnosis not present

## 2019-08-28 DIAGNOSIS — M545 Low back pain: Secondary | ICD-10-CM | POA: Diagnosis not present

## 2019-08-30 ENCOUNTER — Telehealth: Payer: BC Managed Care – PPO | Admitting: Family

## 2019-08-30 ENCOUNTER — Inpatient Hospital Stay (HOSPITAL_COMMUNITY)
Admission: EM | Admit: 2019-08-30 | Discharge: 2019-09-01 | DRG: 057 | Disposition: A | Discharge status: Home / Self Care | Payer: BC Managed Care – PPO | Attending: Internal Medicine | Admitting: Internal Medicine

## 2019-08-30 ENCOUNTER — Encounter (HOSPITAL_COMMUNITY): Payer: Self-pay | Admitting: *Deleted

## 2019-08-30 ENCOUNTER — Emergency Department (HOSPITAL_COMMUNITY): Payer: BC Managed Care – PPO

## 2019-08-30 ENCOUNTER — Other Ambulatory Visit: Payer: Self-pay

## 2019-08-30 ENCOUNTER — Ambulatory Visit
Admission: EM | Admit: 2019-08-30 | Discharge: 2019-08-30 | Disposition: A | Discharge status: Home / Self Care | Payer: BC Managed Care – PPO | Source: Home / Self Care

## 2019-08-30 DIAGNOSIS — G9520 Unspecified cord compression: Secondary | ICD-10-CM | POA: Diagnosis not present

## 2019-08-30 DIAGNOSIS — R200 Anesthesia of skin: Secondary | ICD-10-CM

## 2019-08-30 DIAGNOSIS — D72829 Elevated white blood cell count, unspecified: Secondary | ICD-10-CM | POA: Diagnosis not present

## 2019-08-30 DIAGNOSIS — G834 Cauda equina syndrome: Secondary | ICD-10-CM | POA: Diagnosis not present

## 2019-08-30 DIAGNOSIS — Z20822 Contact with and (suspected) exposure to covid-19: Secondary | ICD-10-CM | POA: Diagnosis present

## 2019-08-30 DIAGNOSIS — Z791 Long term (current) use of non-steroidal anti-inflammatories (NSAID): Secondary | ICD-10-CM | POA: Diagnosis not present

## 2019-08-30 DIAGNOSIS — M5442 Lumbago with sciatica, left side: Secondary | ICD-10-CM | POA: Diagnosis present

## 2019-08-30 DIAGNOSIS — M5127 Other intervertebral disc displacement, lumbosacral region: Secondary | ICD-10-CM | POA: Diagnosis present

## 2019-08-30 DIAGNOSIS — Z833 Family history of diabetes mellitus: Secondary | ICD-10-CM | POA: Diagnosis not present

## 2019-08-30 DIAGNOSIS — D1809 Hemangioma of other sites: Secondary | ICD-10-CM | POA: Diagnosis not present

## 2019-08-30 DIAGNOSIS — Z90710 Acquired absence of both cervix and uterus: Secondary | ICD-10-CM | POA: Diagnosis not present

## 2019-08-30 DIAGNOSIS — Z79899 Other long term (current) drug therapy: Secondary | ICD-10-CM

## 2019-08-30 DIAGNOSIS — K219 Gastro-esophageal reflux disease without esophagitis: Secondary | ICD-10-CM | POA: Diagnosis present

## 2019-08-30 DIAGNOSIS — M545 Low back pain, unspecified: Secondary | ICD-10-CM | POA: Insufficient documentation

## 2019-08-30 DIAGNOSIS — T380X5A Adverse effect of glucocorticoids and synthetic analogues, initial encounter: Secondary | ICD-10-CM | POA: Diagnosis present

## 2019-08-30 DIAGNOSIS — G950 Syringomyelia and syringobulbia: Secondary | ICD-10-CM | POA: Diagnosis not present

## 2019-08-30 DIAGNOSIS — M549 Dorsalgia, unspecified: Secondary | ICD-10-CM | POA: Diagnosis not present

## 2019-08-30 DIAGNOSIS — Z87442 Personal history of urinary calculi: Secondary | ICD-10-CM

## 2019-08-30 DIAGNOSIS — Z87891 Personal history of nicotine dependence: Secondary | ICD-10-CM | POA: Diagnosis not present

## 2019-08-30 DIAGNOSIS — Z03818 Encounter for observation for suspected exposure to other biological agents ruled out: Secondary | ICD-10-CM | POA: Diagnosis not present

## 2019-08-30 LAB — CBC WITH DIFFERENTIAL/PLATELET
Abs Immature Granulocytes: 0.07 10*3/uL (ref 0.00–0.07)
Basophils Absolute: 0 10*3/uL (ref 0.0–0.1)
Basophils Relative: 0 %
Eosinophils Absolute: 0 10*3/uL (ref 0.0–0.5)
Eosinophils Relative: 0 %
HCT: 39.1 % (ref 36.0–46.0)
Hemoglobin: 12.5 g/dL (ref 12.0–15.0)
Immature Granulocytes: 1 %
Lymphocytes Relative: 14 %
Lymphs Abs: 2 10*3/uL (ref 0.7–4.0)
MCH: 28 pg (ref 26.0–34.0)
MCHC: 32 g/dL (ref 30.0–36.0)
MCV: 87.7 fL (ref 80.0–100.0)
Monocytes Absolute: 0.9 10*3/uL (ref 0.1–1.0)
Monocytes Relative: 7 %
Neutro Abs: 11.1 10*3/uL — ABNORMAL HIGH (ref 1.7–7.7)
Neutrophils Relative %: 78 %
Platelets: 308 10*3/uL (ref 150–400)
RBC: 4.46 MIL/uL (ref 3.87–5.11)
RDW: 13.1 % (ref 11.5–15.5)
WBC: 14.1 10*3/uL — ABNORMAL HIGH (ref 4.0–10.5)
nRBC: 0 % (ref 0.0–0.2)

## 2019-08-30 LAB — BASIC METABOLIC PANEL
Anion gap: 10 (ref 5–15)
BUN: 17 mg/dL (ref 6–20)
CO2: 25 mmol/L (ref 22–32)
Calcium: 9.5 mg/dL (ref 8.9–10.3)
Chloride: 103 mmol/L (ref 98–111)
Creatinine, Ser: 0.64 mg/dL (ref 0.44–1.00)
GFR calc Af Amer: >60 mL/min (ref >60)
GFR calc non Af Amer: >60 mL/min (ref >60)
Glucose, Bld: 113 mg/dL — ABNORMAL HIGH (ref 70–99)
Potassium: 3.3 mmol/L — ABNORMAL LOW (ref 3.5–5.1)
Sodium: 138 mmol/L (ref 135–145)

## 2019-08-30 LAB — URINALYSIS, ROUTINE W REFLEX MICROSCOPIC
Bilirubin Urine: NEGATIVE
Glucose, UA: NEGATIVE mg/dL
Ketones, ur: NEGATIVE mg/dL
Leukocytes,Ua: NEGATIVE
Nitrite: NEGATIVE
Protein, ur: NEGATIVE mg/dL
Specific Gravity, Urine: 1.025 (ref 1.005–1.030)
pH: 5 (ref 5.0–8.0)

## 2019-08-30 LAB — SARS CORONAVIRUS 2 BY RT PCR (HOSPITAL ORDER, PERFORMED IN ~~LOC~~ HOSPITAL LAB): SARS Coronavirus 2: NEGATIVE

## 2019-08-30 LAB — GLUCOSE, CAPILLARY
Glucose-Capillary: 106 mg/dL — ABNORMAL HIGH (ref 70–99)
Glucose-Capillary: 158 mg/dL — ABNORMAL HIGH (ref 70–99)

## 2019-08-30 IMAGING — MR MR LUMBAR SPINE W/O CM
4 of 5 series · 13 of 48 positions shown · non-contrast
Comparison: None.

CLINICAL DATA: Low back pain. Cauda equina syndrome suspected. HAH
high

EXAM:
MRI LUMBAR SPINE WITHOUT CONTRAST
TECHNIQUE: Multiplanar, multisequence MR imaging of the lumbar spine was
performed. No intravenous contrast was administered.

[Series 3: T2 · sagittal · 4.0mm · 0.41mm/px · 4 of 15 slices shown (1 of 3)]
[im 1/15]
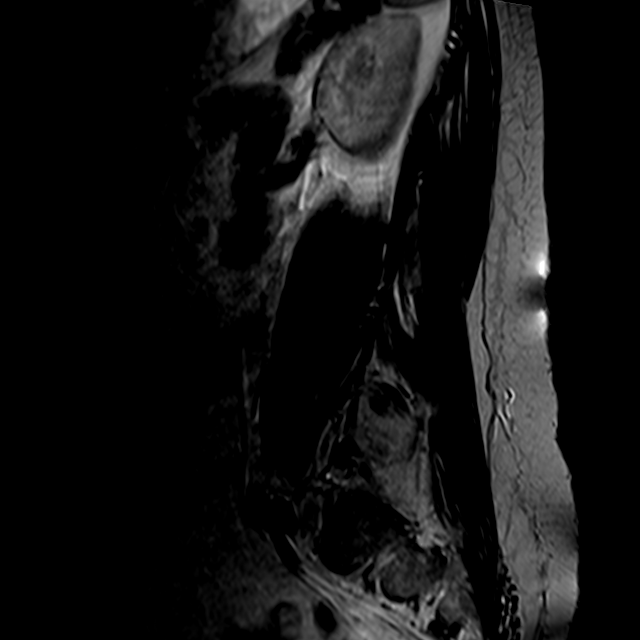
[im 4/15]
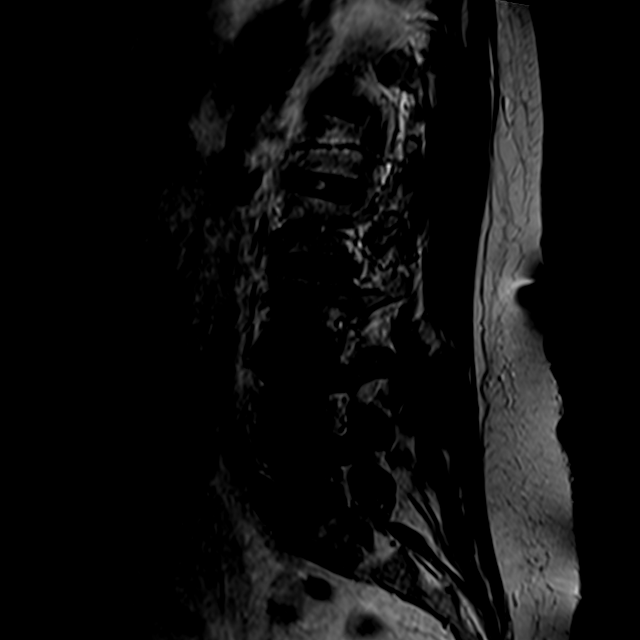
[im 8/15]
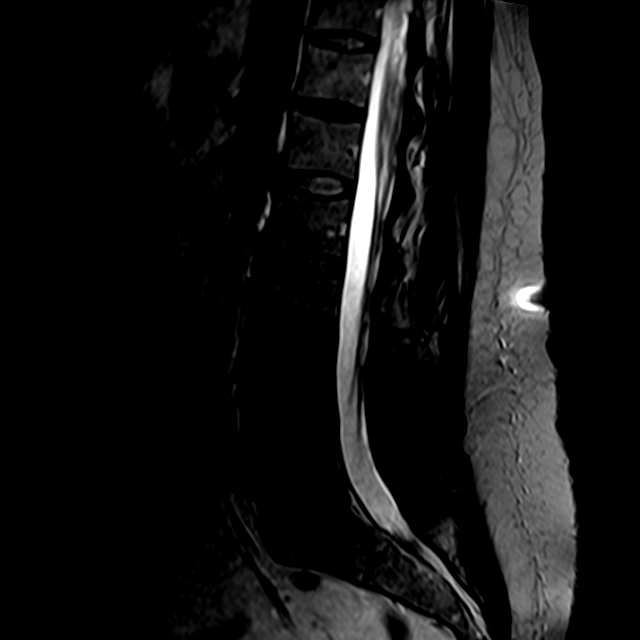
[im 15/15]
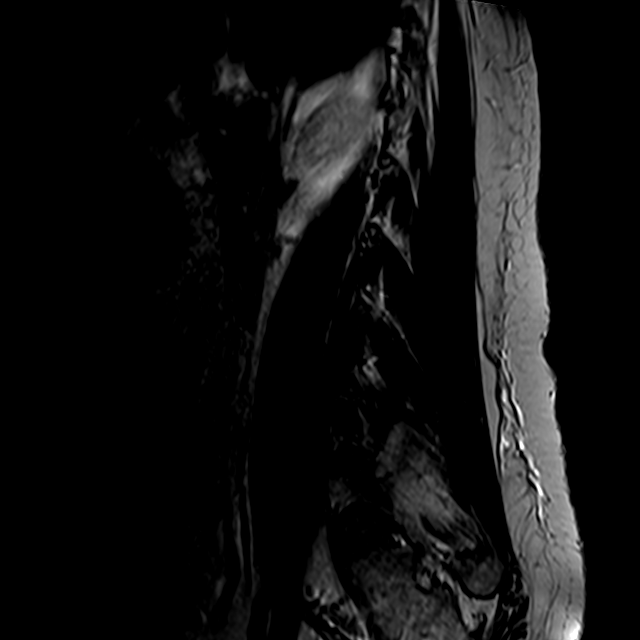

[Series 4: T1 · sagittal · 4.0mm · 0.41mm/px · 3 of 15 slices shown]
[im 3/15]
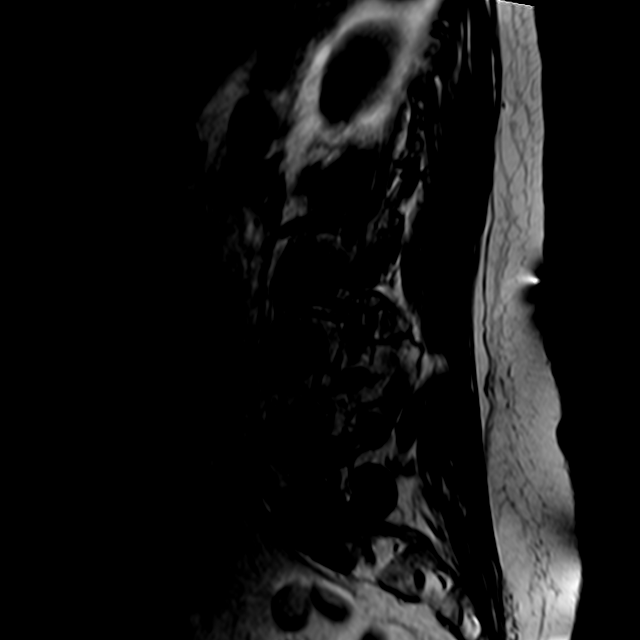
[im 9/15]
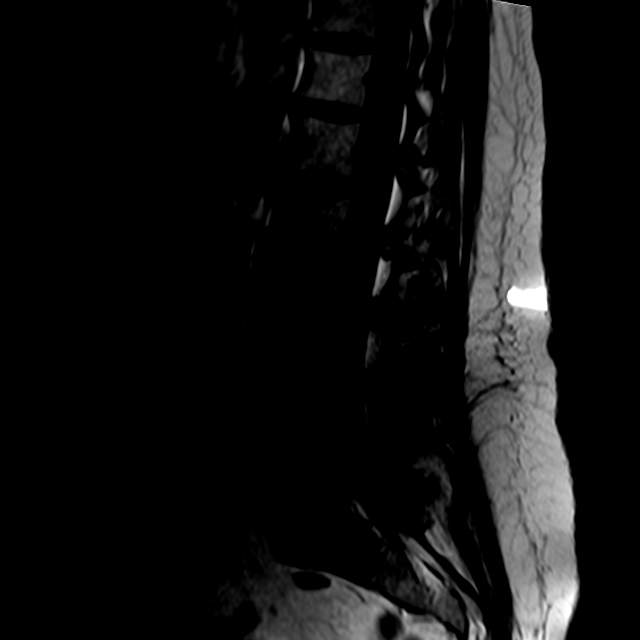
[im 15/15]
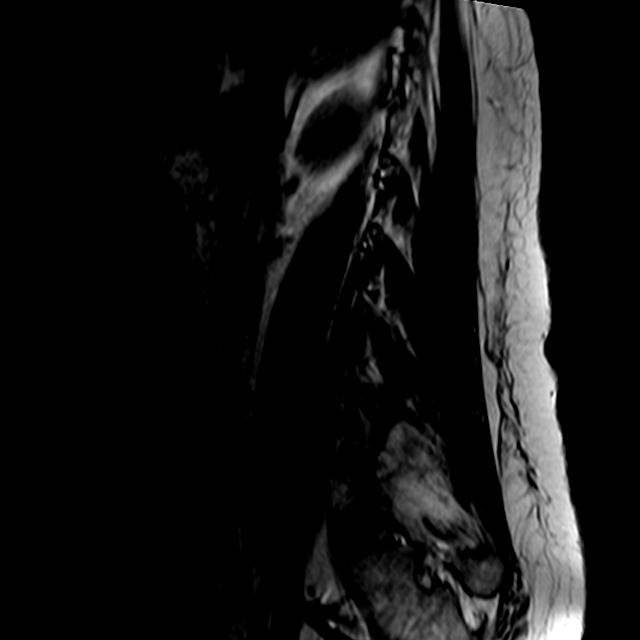

[Series 5: T2 · sagittal · 4.0mm · 0.41mm/px · 3 of 18 slices shown (2 of 3)]
[im 3/18]
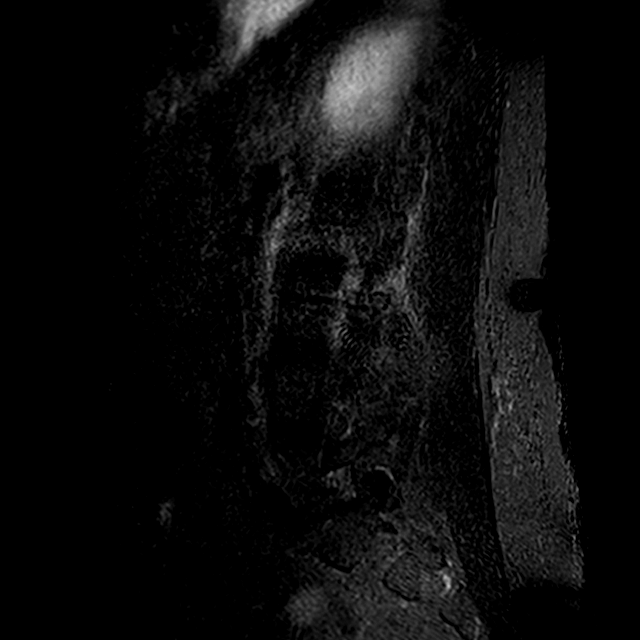
[im 9/18]
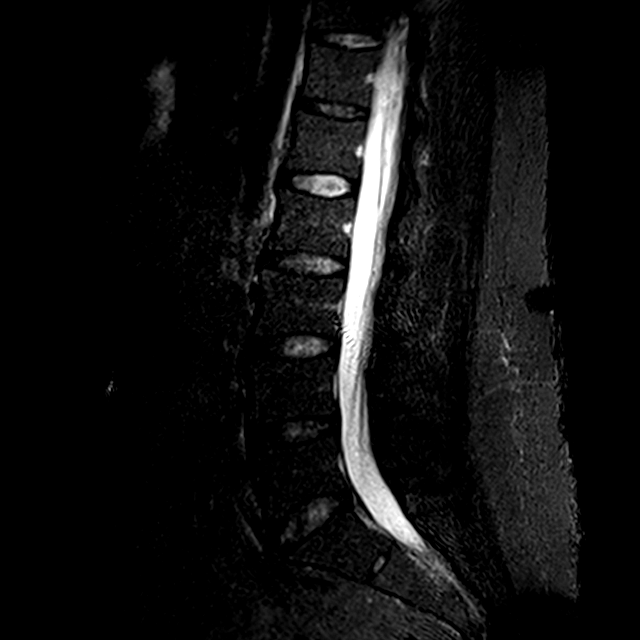
[im 15/18]
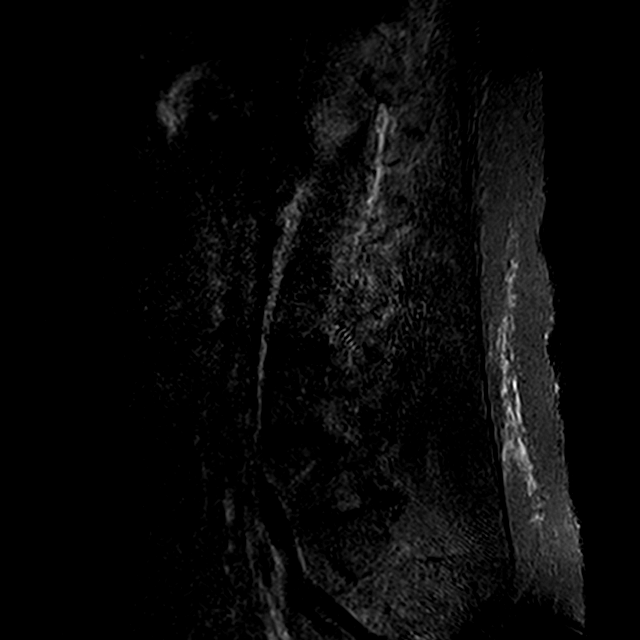

[Series 6: T2 · axial · 4.0mm · 0.26mm/px · z∈[-16,+126]mm · 3 of 40 slices shown (3 of 3)]
[im 6/40]
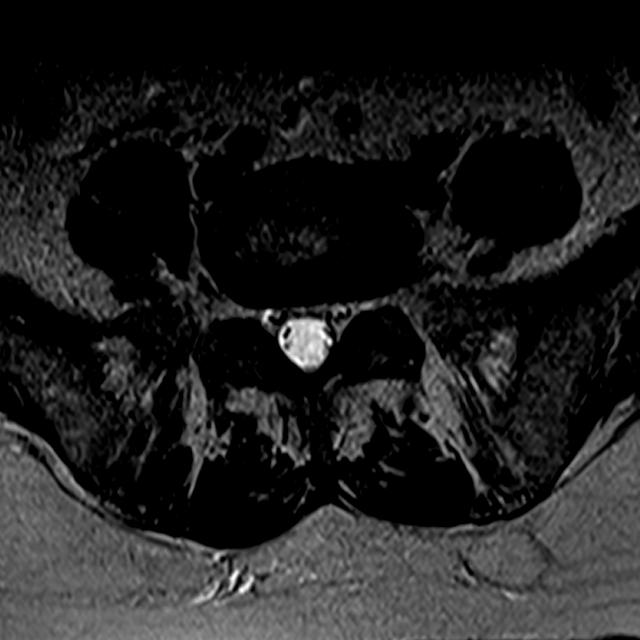
[im 20/40]
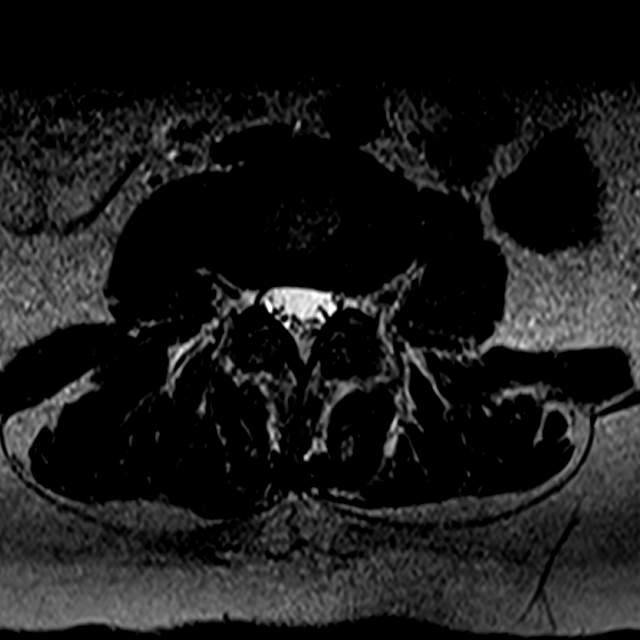
[im 34/40]
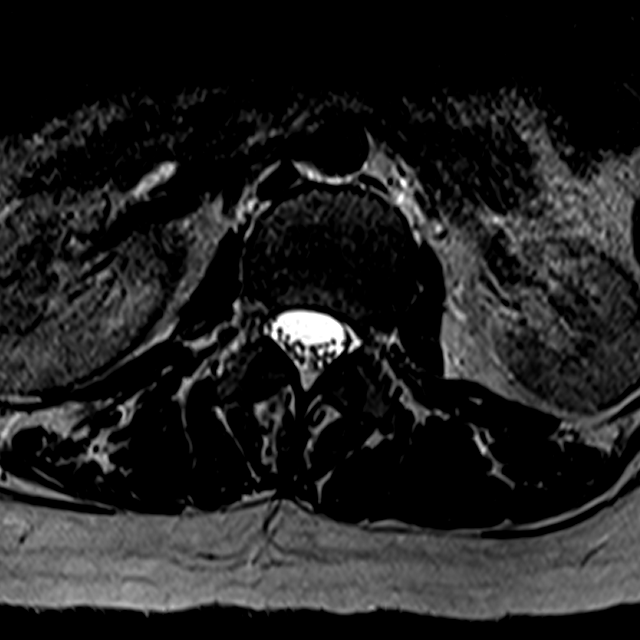

[13 of 48 positions shown; findings below may reference images not displayed]

FINDINGS: Segmentation:  Standard.

Alignment:  Physiologic.

Vertebrae:  No fracture, evidence of discitis, or bone lesion.

Conus medullaris and cauda equina: Conus extends to the L1 level.
Conus and cauda equina appear normal.

Paraspinal and other soft tissues: Negative.

Disc levels:

T12-L1: No spinal canal or neural foraminal stenosis.

L1-2: No spinal canal or neural foraminal stenosis.

L2-3: No spinal canal or neural foraminal stenosis.

L3-4: No spinal canal or neural foraminal stenosis.

L4-5: No spinal canal or neural foraminal stenosis.

L5-S1: Small left foraminal/extraforaminal disc protrusion resulting
in minimal narrowing of the left neural foramen. No significant
spinal canal stenosis.
IMPRESSION: 1. No acute abnormality of the lumbar spine. No cauda equina
compression.
2. Tiny left foraminal/extraforaminal disc protrusion at L5-S1
resulting in minimal narrowing of the left neural foramen.

## 2019-08-30 MED ORDER — HYDROMORPHONE HCL 1 MG/ML IJ SOLN: 1.0000 mg | INTRAMUSCULAR | Status: DC | PRN

## 2019-08-30 MED ORDER — LORAZEPAM 2 MG/ML IJ SOLN: 1.0000 mg | Freq: Four times a day (QID) | INTRAMUSCULAR | Status: DC | PRN

## 2019-08-30 MED ORDER — HYDROMORPHONE HCL 1 MG/ML IJ SOLN
1.0000 mg | Freq: Once | INTRAMUSCULAR | Status: AC
  Administered 2019-08-30: 1 mg via INTRAVENOUS
  Filled 2019-08-30: qty 1

## 2019-08-30 MED ORDER — POLYETHYLENE GLYCOL 3350 17 G PO PACK
17.0000 g | PACK | Freq: Every day | ORAL | Status: DC
  Administered 2019-08-30 – 2019-09-01 (×3): 17 g via ORAL
  Filled 2019-08-30 (×3): qty 1

## 2019-08-30 MED ORDER — PANTOPRAZOLE SODIUM 40 MG PO TBEC
40.0000 mg | DELAYED_RELEASE_TABLET | Freq: Every day | ORAL | Status: DC
  Administered 2019-08-30 – 2019-09-01 (×3): 40 mg via ORAL
  Filled 2019-08-30 (×3): qty 1

## 2019-08-30 MED ORDER — METHOCARBAMOL 500 MG PO TABS
750.0000 mg | ORAL_TABLET | Freq: Four times a day (QID) | ORAL | Status: DC
  Administered 2019-08-30 – 2019-09-01 (×7): 750 mg via ORAL
  Filled 2019-08-30 (×7): qty 2

## 2019-08-30 MED ORDER — SENNOSIDES-DOCUSATE SODIUM 8.6-50 MG PO TABS
2.0000 | ORAL_TABLET | Freq: Every day | ORAL | Status: DC
  Administered 2019-08-30 – 2019-08-31 (×2): 2 via ORAL
  Filled 2019-08-30 (×4): qty 2

## 2019-08-30 MED ORDER — TRAZODONE HCL 50 MG PO TABS: 50.0000 mg | ORAL_TABLET | Freq: Every evening | ORAL | Status: DC | PRN

## 2019-08-30 MED ORDER — SODIUM CHLORIDE 0.9% FLUSH: 3.0000 mL | INTRAVENOUS | Status: DC | PRN

## 2019-08-30 MED ORDER — BISACODYL 10 MG RE SUPP: 10.0000 mg | Freq: Every day | RECTAL | Status: DC | PRN

## 2019-08-30 MED ORDER — SODIUM CHLORIDE 0.9 % IV SOLN
1000.0000 mg | Freq: Once | INTRAVENOUS | Status: AC
  Administered 2019-08-30: 1000 mg via INTRAVENOUS
  Filled 2019-08-30: qty 8

## 2019-08-30 MED ORDER — ONDANSETRON HCL 4 MG PO TABS: 4.0000 mg | ORAL_TABLET | Freq: Four times a day (QID) | ORAL | Status: DC | PRN

## 2019-08-30 MED ORDER — HEPARIN SODIUM (PORCINE) 5000 UNIT/ML IJ SOLN
5000.0000 [IU] | Freq: Three times a day (TID) | INTRAMUSCULAR | Status: DC
  Administered 2019-08-30 – 2019-08-31 (×3): 5000 [IU] via SUBCUTANEOUS
  Filled 2019-08-30 (×4): qty 1

## 2019-08-30 MED ORDER — OXYCODONE HCL 5 MG PO TABS
5.0000 mg | ORAL_TABLET | ORAL | Status: DC | PRN
  Administered 2019-08-30 – 2019-08-31 (×3): 5 mg via ORAL
  Filled 2019-08-30 (×3): qty 1

## 2019-08-30 MED ORDER — POTASSIUM CHLORIDE CRYS ER 20 MEQ PO TBCR
40.0000 meq | EXTENDED_RELEASE_TABLET | ORAL | Status: AC
  Administered 2019-08-30: 40 meq via ORAL
  Filled 2019-08-30: qty 2

## 2019-08-30 MED ORDER — LORAZEPAM 2 MG/ML IJ SOLN
1.0000 mg | Freq: Once | INTRAMUSCULAR | Status: AC
  Administered 2019-08-30: 1 mg via INTRAVENOUS
  Filled 2019-08-30: qty 1

## 2019-08-30 MED ORDER — SODIUM CHLORIDE 0.9 % IV SOLN: 250.0000 mL | INTRAVENOUS | Status: DC | PRN

## 2019-08-30 MED ORDER — ONDANSETRON HCL 4 MG/2ML IJ SOLN: 4.0000 mg | Freq: Four times a day (QID) | INTRAMUSCULAR | Status: DC | PRN

## 2019-08-30 MED ORDER — SODIUM CHLORIDE 0.9% FLUSH
3.0000 mL | Freq: Two times a day (BID) | INTRAVENOUS | Status: DC
  Administered 2019-08-30 – 2019-09-01 (×4): 3 mL via INTRAVENOUS

## 2019-08-30 NOTE — H&P (Addendum)
Patient Demographics:    Janice Ibarra, is a 44 y.o. female  MRN: [id number]   DOB - 1975/11/18  Admit Date - 08/30/2019  Outpatient Primary MD for the patient is Sasser, Silvestre Moment, MD   Assessment & Plan:    Principal Problem:   Possible Lumbar cord compression [facility]) Active Problems:   Low back pain-Possible Spinal Cord Compression--saddle anesthesia, urinary incontinence, constipation, rectal sphincter tone problems, back pain and leg weakness   GERD (gastroesophageal reflux disease)   1)Possible Spinal Cord Compression--patient presented with back pain since 08/25/2019 associated with saddle anesthesia (Lt inner thigh--please see depiction in EDP note from 08/30/2019) , urinary incontinence, constipation, rectal sphincter tone problems and lt leg weakness- --MRI lumbar spine without definite cord compression -EDP discussed case and reviewed MRI with Dr. Duffy Rhody neurosurgeon--- recommended steroids and no neurosurgical intervention at this time -Patient received IV Solu-Medrol 1 g on 08/30/2019 -Please order additional doses of steroids as deemed necessary by neurosurgery or neurologist -As needed muscle relaxant and opiates as ordered -check Accu-Cheks/fingersticks while on high-dose steroids -Hold off on physical therapy eval until  patient is seen by neurosurgical service  2)H/o GERD--Protonix while on high-dose Solu-Medrol  3)Leukocytosis--- spread to steroid-induced, it may persist due to steroids, no evidence of acute infection at this time -Await urine culture  With History of - Reviewed by me  Past Medical History:  Diagnosis Date  . Headache   . History of kidney stones   . Lymphedema   . PONV (postoperative nausea and vomiting)       Past Surgical History:  Procedure Laterality Date  .  ABDOMINAL HYSTERECTOMY    . CHOLECYSTECTOMY    . COLONOSCOPY N/A 02/19/2017   Procedure: COLONOSCOPY;  Surgeon: Rogene Houston, MD;  [facility];  Service: Endoscopy;  Laterality: N/A;  7:30  . ESCHAROTOMY    . ESOPHAGEAL DILATION N/A 02/19/2017   Procedure: ESOPHAGEAL DILATION;  Surgeon: Rogene Houston, MD;  Location: [facility];  Service: Endoscopy;  Laterality: N/A;  . ESOPHAGOGASTRODUODENOSCOPY N/A 02/19/2017   Procedure: ESOPHAGOGASTRODUODENOSCOPY (EGD);  Surgeon: Rogene Houston, MD;  Location[facility];  Service: Endoscopy;  Laterality: N/A;  . SKIN GRAFT Right    leg and arm    Chief Complaint  Patient presents with  . Back Pain      HPI:    Janice Ibarra  is a 44 y.o. female with history of GERD and nephrolithiasis presents to the ED with severe low back pain since 08/25/2019 after moving a piece of furniture -She reaggravated the back pain on 08/28/2019 after being startled and jumping awkwardly - EDP called with concerns about Possible Spinal Cord Compression--saddle anesthesia, urinary incontinence, constipation, rectal sphincter tone problems, back pain and leg weakness -EDP discussed case with on-call neurosurgeon Dr. Duffy Rhody who reviewed patient MRI and chart with EDP and recommended steroids but none neurosurgical intervention at this time -MRI of  lumbar spine without acute cord compression  -UA mostly unremarkable -Additional history obtained from patient's husband at bedside -WBC 14.1 but patient has been on steroids for back pain -Creatinine 0.6 with potassium of 3.3 and glucose of 113  -Patient previously was vaccinated against COVID-19  No fever  Or chills   No Nausea, Vomiting or Diarrhea    Review of systems:    In addition to the HPI above,   A full Review of  Systems was done, all other systems reviewed are negative except as noted above in HPI , .    Social History:  Reviewed by me    Social History   Tobacco  Use  . Smoking status: Former Research scientist (life sciences)  . Smokeless tobacco: Never Used  . Tobacco comment: quit 2002  Substance Use Topics  . Alcohol use: No     Family History :  Reviewed by me    Family History  Problem Relation Age of Onset  . Diabetes Mother      Home Medications:   Prior to Admission medications   Medication Sig Start Date End Date Taking? Authorizing Provider  cyclobenzaprine (FLEXERIL) 10 MG tablet Take 1 tablet by mouth every evening. 08/28/19  Yes [provider]  HYDROcodone-acetaminophen (NORCO) 10-325 MG tablet Take 1 tablet by mouth 4 (four) times daily as needed. 08/28/19  Yes [provider]  ibuprofen (ADVIL,MOTRIN) 200 MG tablet Take 200 mg every 8 (eight) hours as needed by mouth for headache.   Yes [provider]  predniSONE (DELTASONE) 20 MG tablet TAKE TWO TABLETS BY MOUTH FOR SEVEN DAYS, THEN TAKE 1 TABLET FOR SEVEN DAYS 08/28/19  Yes [provider]  lubiprostone (AMITIZA) 8 MCG capsule Take 1 capsule (8 mcg total) by mouth 2 (two) times daily with a meal. Patient not taking: Reported on 08/30/2019 12/22/16   Butch Penny, NP     Allergies:     Allergies  Allergen Reactions  . Penicillins Anaphylaxis  . Erythromycin Other (See Comments)    Pt states this was a childhood allergy but has taken other MYCIN drugs since     Physical Exam:   Vitals  Blood pressure 115/70, pulse 63, temperature 98.3 F (36.8 C), resp. rate 18, height 5\' 5"  (1.651 m), weight 74.8 kg, SpO2 98 %.  Physical Examination: General appearance - alert, well appearing, and in no distress  Mental status - alert, oriented to person, place, and time Eyes - sclera anicteric Neck - supple, no JVD elevation , Chest - clear  to auscultation bilaterally, symmetrical air movement, Heart - S1 and S2 normal, regular  Abdomen - soft, nontender, nondistended, no masses or organomegaly Neurological - screening mental status exam normal, neck supple  without rigidity, cranial nerves II through XII intact, DTR's normal and symmetric Extremities - no pedal edema noted, intact peripheral pulses  Skin - warm, dry MSK- saddle anesthesia (Lt inner thigh--please see depiction in EDP note from 08/30/2019)      Data Review:    CBC Recent Labs  Lab 08/30/19 1221  WBC 14.1*  HGB 12.5  HCT 39.1  PLT 308  MCV 87.7  MCH 28.0  MCHC 32.0  RDW 13.1  LYMPHSABS 2.0  MONOABS 0.9  EOSABS 0.0  BASOSABS 0.0   ------------------------------------------------------------------------------------------------------------------  Chemistries  Recent Labs  Lab 08/30/19 1221  NA 138  K 3.3*  CL 103  CO2 25  GLUCOSE 113*  BUN 17  CREATININE 0.64  CALCIUM 9.5   ------------------------------------------------------------------------------------------------------------------ estimated  creatinine clearance is 91.8 mL/min (by C-G formula based on SCr of 0.64 mg/dL). ------------------------------------------------------------------------------------------------------------------ No results for input(s): TSH, T4TOTAL, T3FREE, THYROIDAB in the last 72 hours.  Invalid input(s): FREET3   Coagulation profile No results for input(s): INR, PROTIME in the last 168 hours. ------------------------------------------------------------------------------------------------------------------- No results for input(s): DDIMER in the last 72 hours. -------------------------------------------------------------------------------------------------------------------  Cardiac Enzymes No results for input(s): CKMB, TROPONINI, MYOGLOBIN in the last 168 hours.  Invalid input(s): CK ------------------------------------------------------------------------------------------------------------------ No results found for: BNP   ---------------------------------------------------------------------------------------------------------------  Urinalysis    Component  Value Date/Time   COLORURINE YELLOW 08/30/2019 [place] 08/30/2019 1334   LABSPEC 1.025 08/30/2019 1334   PHURINE 5.0 08/30/2019 1334   GLUCOSEU NEGATIVE 08/30/2019 1334   HGBUR SMALL (A) 08/30/2019 1334   [place] 08/30/2019 1334   [place] 08/30/2019 1334   PROTEINUR NEGATIVE 08/30/2019 1334   NITRITE NEGATIVE 08/30/2019 1334   LEUKOCYTESUR NEGATIVE 08/30/2019 1334    ----------------------------------------------------------------------------------------------------------------   Imaging Results:    MR LUMBAR SPINE WO CONTRAST  Result Date: 08/30/2019 CLINICAL DATA:  Low back pain. Cauda equina syndrome suspected. HAH high EXAM: MRI LUMBAR SPINE WITHOUT CONTRAST TECHNIQUE: Multiplanar, multisequence MR imaging of the lumbar spine was performed. No intravenous contrast was administered. COMPARISON:  None. FINDINGS: Segmentation:  Standard. Alignment:  Physiologic. Vertebrae:  No fracture, evidence of discitis, or bone lesion. Conus medullaris and cauda equina: Conus extends to the L1 level. Conus and cauda equina appear normal. Paraspinal and other soft tissues: Negative. Disc levels: T12-L1: No spinal canal or neural foraminal stenosis. L1-2: No spinal canal or neural foraminal stenosis. L2-3: No spinal canal or neural foraminal stenosis. L3-4: No spinal canal or neural foraminal stenosis. L4-5: No spinal canal or neural foraminal stenosis. L5-S1: Small left foraminal/extraforaminal disc protrusion resulting in minimal narrowing of the left neural foramen. No significant spinal canal stenosis. IMPRESSION: 1. No acute abnormality of the lumbar spine. No cauda equina compression. 2. Tiny left foraminal/extraforaminal disc protrusion at L5-S1 resulting in minimal narrowing of the left neural foramen. Electronically Signed   By: Pedro Earls M.D.   On: 08/30/2019 13:07    Radiological Exams on Admission: MR LUMBAR SPINE WO  CONTRAST  Result Date: 08/30/2019 CLINICAL DATA:  Low back pain. Cauda equina syndrome suspected. HAH high EXAM: MRI LUMBAR SPINE WITHOUT CONTRAST TECHNIQUE: Multiplanar, multisequence MR imaging of the lumbar spine was performed. No intravenous contrast was administered. COMPARISON:  None. FINDINGS: Segmentation:  Standard. Alignment:  Physiologic. Vertebrae:  No fracture, evidence of discitis, or bone lesion. Conus medullaris and cauda equina: Conus extends to the L1 level. Conus and cauda equina appear normal. Paraspinal and other soft tissues: Negative. Disc levels: T12-L1: No spinal canal or neural foraminal stenosis. L1-2: No spinal canal or neural foraminal stenosis. L2-3: No spinal canal or neural foraminal stenosis. L3-4: No spinal canal or neural foraminal stenosis. L4-5: No spinal canal or neural foraminal stenosis. L5-S1: Small left foraminal/extraforaminal disc protrusion resulting in minimal narrowing of the left neural foramen. No significant spinal canal stenosis. IMPRESSION: 1. No acute abnormality of the lumbar spine. No cauda equina compression. 2. Tiny left foraminal/extraforaminal disc protrusion at L5-S1 resulting in minimal narrowing of the left neural foramen. Electronically Signed   By: Pedro Earls M.D.   On: 08/30/2019 13:07    DVT Prophylaxis -SCD  /heparin AM Labs Ordered, also please review Full Orders  Family Communication: Admission, patients condition and plan of care including tests being ordered  have been discussed with the patient and husband who indicate understanding and agree with the plan   Code Status - Full Code  Likely DC to  Home   Condition   stable Roxan Hockey M.D on 08/30/2019 at 6:05 PM Go to [email or web address] -  for contact info  [facility] - Office  [phone]

## 2019-08-30 NOTE — ED Notes (Signed)
Attempted report to [facility]. No response from nurse at this time.

## 2019-08-30 NOTE — Plan of Care (Signed)

## 2019-08-30 NOTE — Progress Notes (Signed)
Based on what you shared with me, I feel your condition warrants further evaluation and I recommend that you be seen for a face to face office visit.  Given your pain, numbness, and leaking urine you need to be seen face to face to rule out a more serious condition.    NOTE: If you entered your credit card information for this eVisit, you will not be charged. You may see a "hold" on your card for the $35 but that hold will drop off and you will not have a charge processed.   If you are having a true medical emergency please call 911.      For an urgent face to face visit, [place] has five urgent care centers for your convenience:      NEW:  [facility] [place] [facility] [place] Get Driving Directions [id number] [address] [place], [place] [address] . 10 am - 6pm Monday - Friday    [facility]) Get Driving Directions [id number] [address] [place], [place] [address] . 10 am to 8 pm Monday-Friday . 12 pm to 8 pm [facility] Get Driving Directions [id number] [place], [place], [place] [address] . 8 am to 8 pm Monday-Friday . 9 am to 6 pm Saturday . 11 am to 6 pm Sunday     [facility] Get Driving Directions  [id number] [address] [place], [place] [address] . 8 am to 8 pm Monday-Friday . 8 am to 4 pm [facility] at [place] Get Driving Directions [id number] [address] [place], [place] [address] . 12 pm to 6 pm Monday-Friday      Your e-visit answers were reviewed by a board certified advanced clinical practitioner to complete your personal care plan.  Thank you for using e-Visits.

## 2019-08-30 NOTE — ED Triage Notes (Signed)
p presents with severe low back pain and dragging left leg with some incontinence of urine. Provider made aware due to pts symptoms pt will need to go to ED for higher level of care

## 2019-08-30 NOTE — Progress Notes (Signed)
Pt arrived to unit via [facility]. Pt in no distress. Call bell within reach. Husband at bedside.

## 2019-08-30 NOTE — ED Triage Notes (Signed)
Pt c/o severe lower back pain that radiates down to left knee. Pt reports she is also "leaking urine". Last Friday pt moved some furniture and reports she hurt her back slightly. Monday she fell and since then she has been having severe pain ever since.

## 2019-08-30 NOTE — ED Provider Notes (Signed)
[facility] EMERGENCY DEPARTMENT Provider Note   CSN: [id number] Arrival date & time: 08/30/19  1125     History Chief Complaint  Patient presents with  . Back Pain    Janice Ibarra is a 44 y.o. female with a hx of kidney stones, lymphedema presents to the Emergency Department complaining of acute, persistent, progressively worsening midline low back pain onset 3 days ago.  Patient reports she moved some heavy furniture on Saturday but had only mild back pain.  She reports this had resolved by Monday and she was able to vacuum the house.  She reports Monday evening someone accidentally scared her and she jumped causing an acute, severe, sharp and shooting pain in her low back.  She reports she did fall and has had severe pain since that time.  She reports she is largely unable to walk due to the pain.  Patient reports she presented to the [facility] emergency department but was not seen.  She was seen by her primary care on Tuesday and given Vicodin and prednisone.  Yesterday she had worsening pain and numbness along her left hip thigh and groin.  She reports she has not had a bowel movement in 5 days.  Additionally, she reports no urge to urinate however multiple instances of urinary incontinence without being aware of same.  She reports she can feel her abdomen distending, but does not have significant pain.  She denies bowel incontinence.  She denies a history of back surgery or back pain.  Any movement or palpation makes the symptoms worse.  Patient reports she is unable to walk because of the severe pain.  Nothing makes her symptoms better despite taking Flexeril, Vicodin and prednisone.  The history is provided by the patient and medical records. No language interpreter was used.       Past Medical History:  Diagnosis Date  . Headache   . History of kidney stones   . Lymphedema   . PONV (postoperative nausea and vomiting)     Patient Active Problem List   Diagnosis Date Noted  .  Esophageal dysphagia 01/19/2017  . Family hx of colon cancer 01/19/2017    Past Surgical History:  Procedure Laterality Date  . ABDOMINAL HYSTERECTOMY    . CHOLECYSTECTOMY    . COLONOSCOPY N/A 02/19/2017   Procedure: COLONOSCOPY;  Surgeon: Rogene Houston, MD;  Location: [facility];  Service: Endoscopy;  Laterality: N/A;  7:30  . ESCHAROTOMY    . ESOPHAGEAL DILATION N/A 02/19/2017   Procedure: ESOPHAGEAL DILATION;  Surgeon: Rogene Houston, MD;  Location: [facility] ENDO SUITE;  Service: Endoscopy;  Laterality: N/A;  . ESOPHAGOGASTRODUODENOSCOPY N/A 02/19/2017   Procedure: ESOPHAGOGASTRODUODENOSCOPY (EGD);  Surgeon: Rogene Houston, MD;  Location: [facility]ITE;  Service: Endoscopy;  Laterality: N/A;  . SKIN GRAFT Right    leg and arm     OB History    Gravida  3   Para  3   Term  3   Preterm      AB      Living  3     SAB      TAB      Ectopic      Multiple      Live Births              Family History  Problem Relation Age of Onset  . Diabetes Mother     Social History   Tobacco Use  . Smoking status: Former Research scientist (life sciences)  .  Smokeless tobacco: Never Used  . Tobacco comment: quit 2002  Substance Use Topics  . Alcohol use: No  . Drug use: No    Home Medications Prior to Admission medications   Medication Sig Start Date End Date Taking? Authorizing Provider  cyclobenzaprine (FLEXERIL) 10 MG tablet Take 1 tablet by mouth every evening. 08/28/19  Yes [provider]  HYDROcodone-acetaminophen (NORCO) 10-325 MG tablet Take 1 tablet by mouth 4 (four) times daily as needed. 08/28/19  Yes [provider]  ibuprofen (ADVIL,MOTRIN) 200 MG tablet Take 200 mg every 8 (eight) hours as needed by mouth for headache.   Yes [provider]  predniSONE (DELTASONE) 20 MG tablet TAKE TWO TABLETS BY MOUTH FOR SEVEN DAYS, THEN TAKE 1 TABLET FOR SEVEN DAYS 08/28/19  Yes [provider]  lubiprostone (AMITIZA) 8 MCG capsule Take 1 capsule (8 mcg  total) by mouth 2 (two) times daily with a meal. Patient not taking: Reported on 08/30/2019 12/22/16   Butch Penny, NP    Allergies    Penicillins and Erythromycin  Review of Systems   Review of Systems  Constitutional: Negative for appetite change, diaphoresis, fatigue, fever and unexpected weight change.  HENT: Negative for mouth sores.   Eyes: Negative for visual disturbance.  Respiratory: Negative for cough, chest tightness, shortness of breath and wheezing.   Cardiovascular: Negative for chest pain.  Gastrointestinal: Positive for constipation. Negative for abdominal pain, diarrhea, nausea and vomiting.  Endocrine: Negative for polydipsia, polyphagia and polyuria.  Genitourinary: Positive for difficulty urinating. Negative for dysuria, frequency, hematuria and urgency.       Urinary incontinence  Musculoskeletal: Positive for back pain and gait problem. Negative for neck stiffness.  Skin: Negative for rash.  Allergic/Immunologic: Negative for immunocompromised state.  Neurological: Negative for syncope, light-headedness and headaches.  Hematological: Does not bruise/bleed easily.  Psychiatric/Behavioral: Negative for sleep disturbance. The patient is not nervous/anxious.     Physical Exam Updated Vital Signs BP 110/68 (BP Location: Left Arm)   Pulse 82   Resp 20   Ht 5\' 5"  (1.651 m)   Wt 74.8 kg   SpO2 100%   BMI 27.46 kg/m   Physical Exam Vitals and nursing note reviewed. Exam conducted with a chaperone present.  Constitutional:      General: She is not in acute distress.    Appearance: She is not diaphoretic.  HENT:     Head: Normocephalic.  Eyes:     General: No scleral icterus.    Conjunctiva/sclera: Conjunctivae normal.  Cardiovascular:     Rate and Rhythm: Normal rate and regular rhythm.     Pulses: Normal pulses.          Radial pulses are 2+ on the right side and 2+ on the left side.  Pulmonary:     Effort: No tachypnea, accessory muscle usage,  prolonged expiration, respiratory distress or retractions.     Breath sounds: No stridor.     Comments: Equal chest rise. No increased work of breathing. Abdominal:     General: There is distension.     Palpations: Abdomen is soft.     Tenderness: There is no abdominal tenderness. There is no guarding or rebound.     Comments: Mild abdominal distention.  Genitourinary:    Exam position: Knee-chest position.       Comments: Left-sided saddle anesthesia. Normal rectal tone at rest however patient unable to squeeze Musculoskeletal:     Cervical back: Normal and normal range of motion.  Thoracic back: Normal.     Lumbar back: Tenderness and bony tenderness present. Decreased range of motion.       Back:     Right hip: No bony tenderness. Normal range of motion.     Left hip: No bony tenderness. Decreased range of motion.       Legs:     Comments: Difficulty moving left lower extremity due to severe pain.  Normal range of motion in bilateral upper extremities and right lower extremity.  5/5 strength in the left great toe and ankle.  Minimal strength in the knee and hip due to extreme pain.  Skin:    General: Skin is warm and dry.     Capillary Refill: Capillary refill takes less than 2 seconds.  Neurological:     Mental Status: She is alert.     GCS: GCS eye subscore is 4. GCS verbal subscore is 5. GCS motor subscore is 6.     Comments: Speech is clear and goal oriented. Significant sensation deficits to the left hip and leg.  Strength decreased in the left lower extremity due to extreme pain.  See musculoskeletal for additional information.  Psychiatric:        Mood and Affect: Mood normal.     ED Results / Procedures / Treatments   Labs (all labs ordered are listed, but only abnormal results are displayed) Labs Reviewed  CBC WITH DIFFERENTIAL/PLATELET - Abnormal; Notable for the following components:      Result Value   WBC 14.1 (*)    Neutro Abs 11.1 (*)    All other  components within normal limits  BASIC METABOLIC PANEL - Abnormal; Notable for the following components:   Potassium 3.3 (*)    Glucose, Bld 113 (*)    All other components within normal limits  URINALYSIS, ROUTINE W REFLEX MICROSCOPIC - Abnormal; Notable for the following components:   Hgb urine dipstick SMALL (*)    Bacteria, UA RARE (*)    All other components within normal limits  URINE CULTURE  SARS CORONAVIRUS 2 BY RT PCR (HOSPITAL ORDER, [place] [facility])    Radiology MR LUMBAR SPINE WO CONTRAST  Result Date: 08/30/2019 CLINICAL DATA:  Low back pain. Cauda equina syndrome suspected. HAH high EXAM: MRI LUMBAR SPINE WITHOUT CONTRAST TECHNIQUE: Multiplanar, multisequence MR imaging of the lumbar spine was performed. No intravenous contrast was administered. COMPARISON:  None. FINDINGS: Segmentation:  Standard. Alignment:  Physiologic. Vertebrae:  No fracture, evidence of discitis, or bone lesion. Conus medullaris and cauda equina: Conus extends to the L1 level. Conus and cauda equina appear normal. Paraspinal and other soft tissues: Negative. Disc levels: T12-L1: No spinal canal or neural foraminal stenosis. L1-2: No spinal canal or neural foraminal stenosis. L2-3: No spinal canal or neural foraminal stenosis. L3-4: No spinal canal or neural foraminal stenosis. L4-5: No spinal canal or neural foraminal stenosis. L5-S1: Small left foraminal/extraforaminal disc protrusion resulting in minimal narrowing of the left neural foramen. No significant spinal canal stenosis. IMPRESSION: 1. No acute abnormality of the lumbar spine. No cauda equina compression. 2. Tiny left foraminal/extraforaminal disc protrusion at L5-S1 resulting in minimal narrowing of the left neural foramen. Electronically Signed   By: Pedro Earls M.D.   On: 08/30/2019 13:07    Procedures Procedures (including critical care time)  Medications Ordered in ED Medications  HYDROmorphone  (DILAUDID) injection 1 mg (1 mg Intravenous Given 08/30/19 1226)  LORazepam (ATIVAN) injection 1 mg (1 mg  Intravenous Given 08/30/19 1227)    ED Course  I have reviewed the triage vital signs and the nursing notes.  Pertinent labs & imaging results that were available during my care of the patient were reviewed by me and considered in my medical decision making (see chart for details).  Clinical Course as of Aug 30 1514  Wed Aug 30, 2019  1450 Discussed with Dr. Marcello Moores, neurosurgery who recommends steroids during admission and patient will consult with neurology if neurologic signs persist.  He has reviewed her MRI and does not feel that she needs neurosurgical intervention at this time.   [HM]    Clinical Course User Index [HM] Thaniel Coluccio, Gwenlyn Perking   MDM Rules/Calculators/A&P                       Patient presents with acute back pain now with urinary retention, incontinence and some saddle anesthesia.  Significant concern for cauda equina.  Patient is unable to ambulate due to severe pain and numbness in the left leg.  Pain control, basic labs and MRI pending.  2:17 PM MRI without evidence of cauda equina however left foraminal/extraforaminal disc protrusion is noted at L5-S1.  Pain likely secondary to aggravated sciatic nerve.  Patient given Dilaudid and Ativan here in the emergency department without significant relief.  She remains unable to ambulate.  Patient unable to care for herself at home given inability to ambulate and with some urinary retention.  Will admit for pain control and physical therapy consultation.  The patient was discussed with and evaluated by Dr. Vanita Panda who agrees with the treatment plan.  Final Clinical Impression(s) / ED Diagnoses Final diagnoses:  Acute left-sided low back pain with left-sided sciatica    Rx / DC Orders ED Discharge Orders    None       Eland Lamantia, Gwenlyn Perking 08/30/19 1516    Carmin Muskrat, MD 08/30/19 2133

## 2019-08-31 ENCOUNTER — Inpatient Hospital Stay (HOSPITAL_COMMUNITY): Payer: BC Managed Care – PPO

## 2019-08-31 DIAGNOSIS — G9520 Unspecified cord compression: Secondary | ICD-10-CM | POA: Diagnosis not present

## 2019-08-31 DIAGNOSIS — M5442 Lumbago with sciatica, left side: Secondary | ICD-10-CM

## 2019-08-31 DIAGNOSIS — M545 Low back pain: Secondary | ICD-10-CM | POA: Diagnosis not present

## 2019-08-31 DIAGNOSIS — M549 Dorsalgia, unspecified: Secondary | ICD-10-CM | POA: Diagnosis not present

## 2019-08-31 DIAGNOSIS — R200 Anesthesia of skin: Secondary | ICD-10-CM | POA: Diagnosis not present

## 2019-08-31 LAB — CBC
HCT: 38.8 % (ref 36.0–46.0)
Hemoglobin: 12.6 g/dL (ref 12.0–15.0)
MCH: 28.3 pg (ref 26.0–34.0)
MCHC: 32.5 g/dL (ref 30.0–36.0)
MCV: 87.2 fL (ref 80.0–100.0)
Platelets: 314 10*3/uL (ref 150–400)
RBC: 4.45 MIL/uL (ref 3.87–5.11)
RDW: 13 % (ref 11.5–15.5)
WBC: 11.5 10*3/uL — ABNORMAL HIGH (ref 4.0–10.5)
nRBC: 0 % (ref 0.0–0.2)

## 2019-08-31 LAB — BASIC METABOLIC PANEL
Anion gap: 11 (ref 5–15)
BUN: 18 mg/dL (ref 6–20)
CO2: 23 mmol/L (ref 22–32)
Calcium: 9.5 mg/dL (ref 8.9–10.3)
Chloride: 102 mmol/L (ref 98–111)
Creatinine, Ser: 0.72 mg/dL (ref 0.44–1.00)
Glucose, Bld: 146 mg/dL — ABNORMAL HIGH (ref 70–99)
Potassium: 4.8 mmol/L (ref 3.5–5.1)
Sodium: 136 mmol/L (ref 135–145)

## 2019-08-31 LAB — GLUCOSE, CAPILLARY
Glucose-Capillary: 135 mg/dL — ABNORMAL HIGH (ref 70–99)
Glucose-Capillary: 100 mg/dL — ABNORMAL HIGH (ref 70–99)
Glucose-Capillary: 122 mg/dL — ABNORMAL HIGH (ref 70–99)

## 2019-08-31 LAB — HIV ANTIBODY (ROUTINE TESTING W REFLEX): HIV Screen 4th Generation wRfx: NONREACTIVE

## 2019-08-31 LAB — URINE CULTURE: Culture: NO GROWTH

## 2019-08-31 IMAGING — MR MR HEAD WO/W CM
7 of 13 series · 23 of 48 positions shown · IV contrast (y gad)
Comparison: None.

CLINICAL DATA: 43-year-old female with back pain, saddle
anesthesia, urinary incontinence after heavy lifting. Unrevealing
noncontrast lumbar MRI, symptom etiology unclear at this time.

EXAM:
MRI HEAD WITHOUT AND WITH CONTRAST
TECHNIQUE: Multiplanar, multiecho pulse sequences of the brain and surrounding
structures were obtained without and with intravenous contrast.
CONTRAST:  7mL GADAVIST GADOBUTROL 1 MMOL/ML IV SOLN

[Series 2: DWI · axial · 3.0mm · 0.94mm/px · z∈[-92,+61]mm · 6 of 104 slices shown (1 of 2)]
[im 1/104]
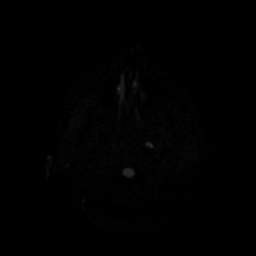
[im 21/104]
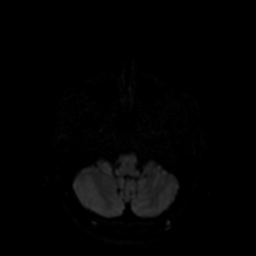
[im 42/104]
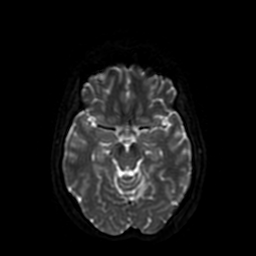
[im 62/104]
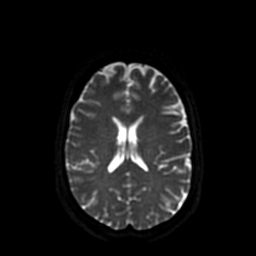
[im 83/104]
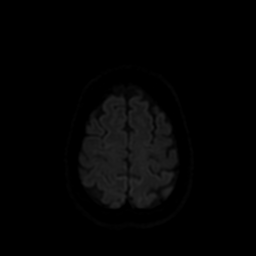
[im 104/104]
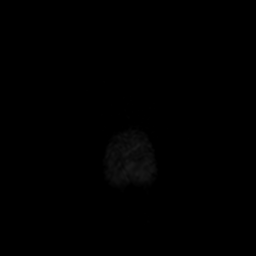

[Series 3: DWI · coronal · 4.0mm · 0.94mm/px · 5 of 76 slices shown (2 of 2)]
[im 1/76]
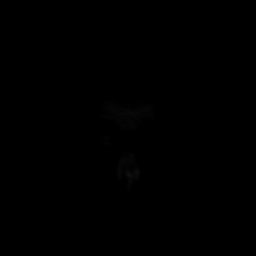
[im 19/76]
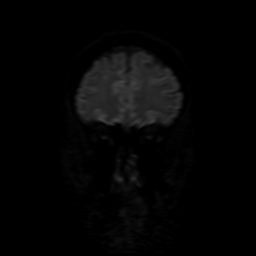
[im 38/76]
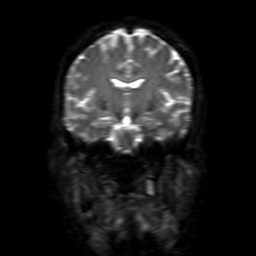
[im 57/76]
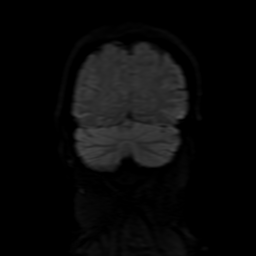
[im 76/76]
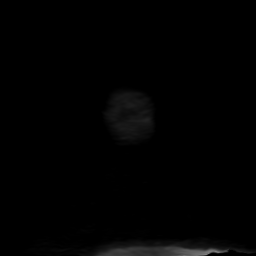

[Series 4: FLAIR · sagittal · 5.0mm · 0.23mm/px · 2 of 23 slices shown (1 of 2)]
[im 1/23]
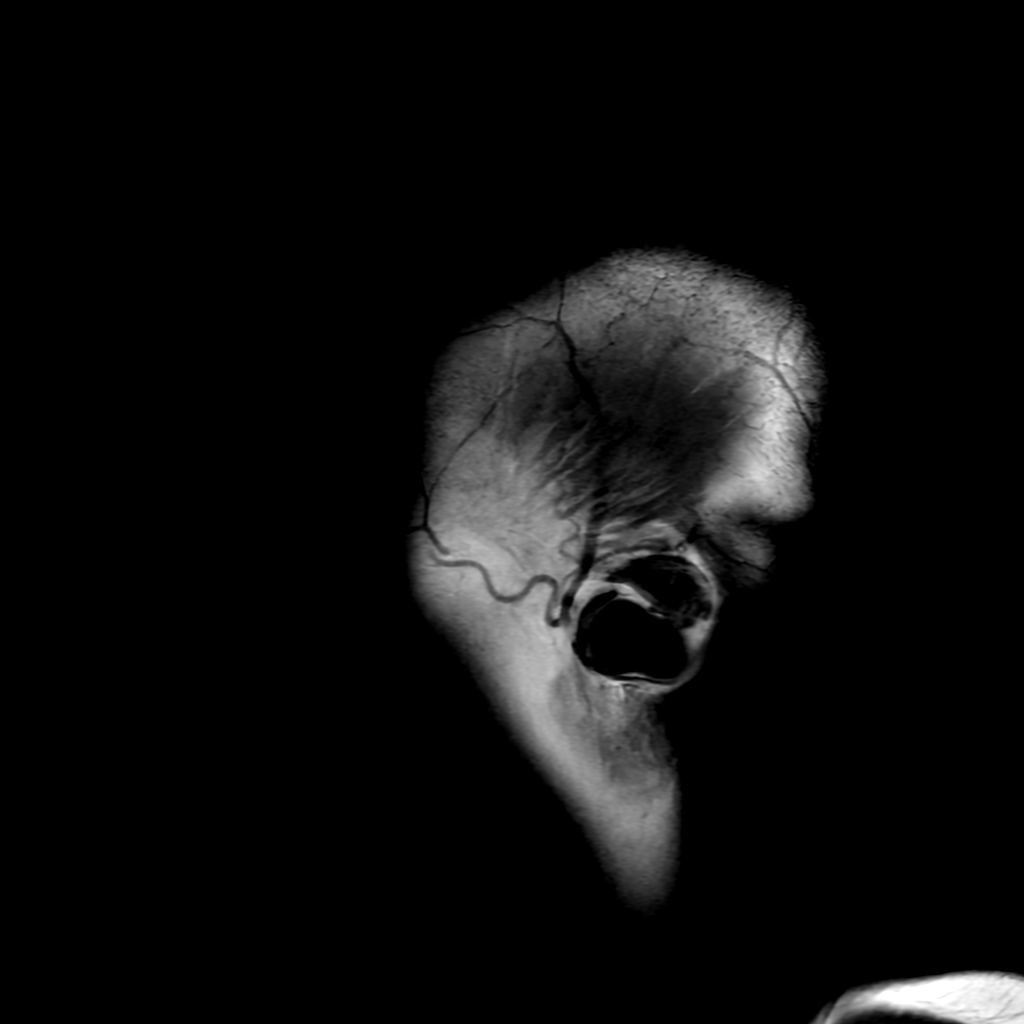
[im 23/23]
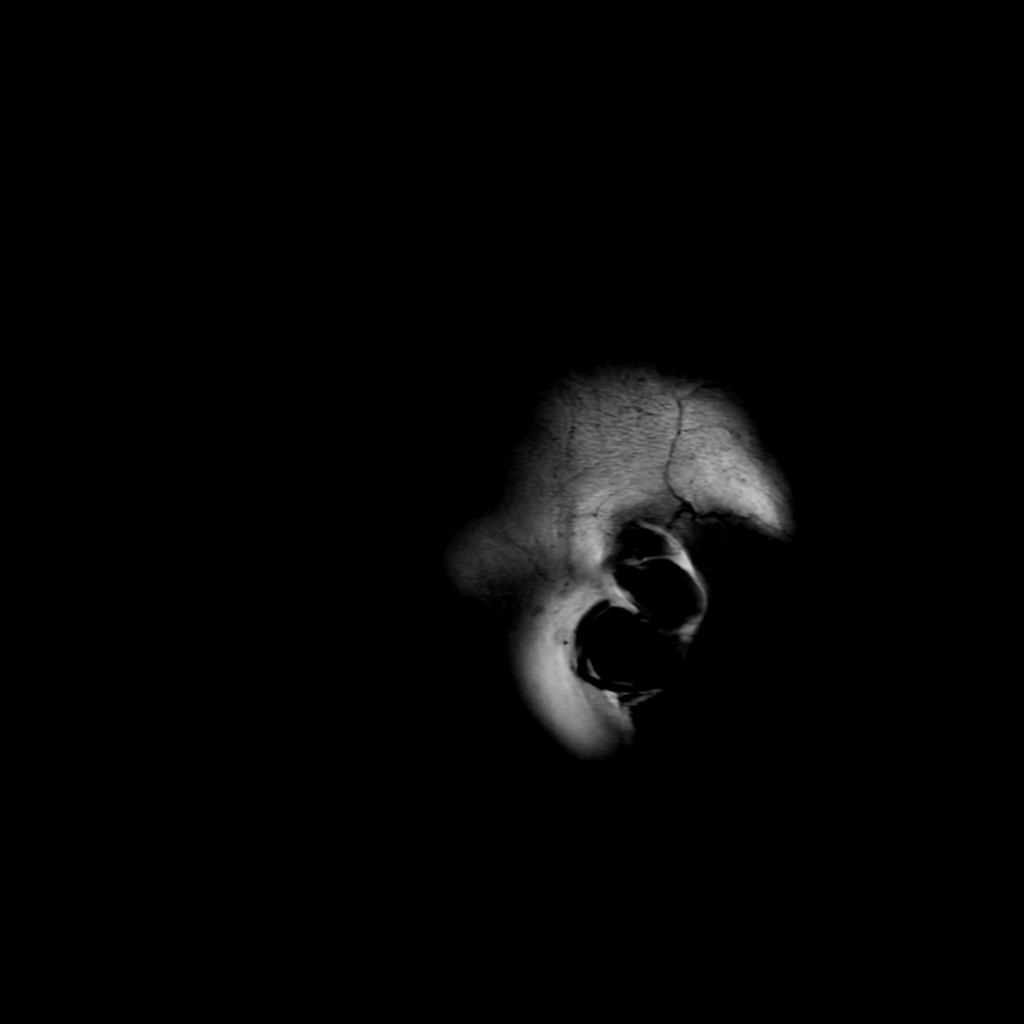

[Series 5: T2 · axial · 5.0mm · 0.23mm/px · 1 of 26 slices shown]
[im 1/26]
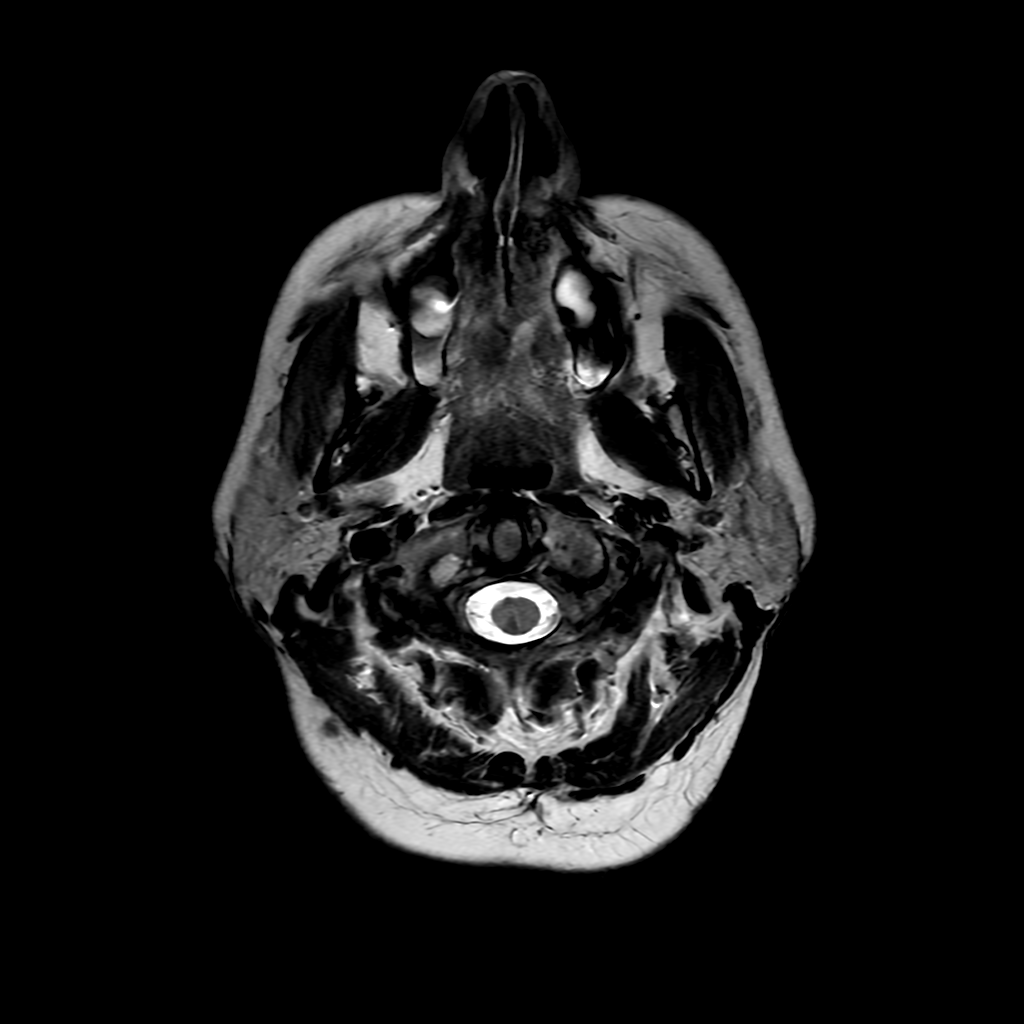

[Series 6: FLAIR · axial · 3.0mm · 0.47mm/px · z∈[-97,+52]mm · 2 of 26 slices shown (2 of 2)]
[im 1/26]
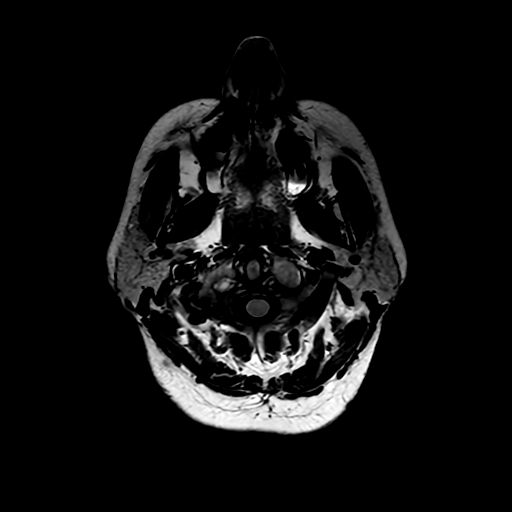
[im 26/26]
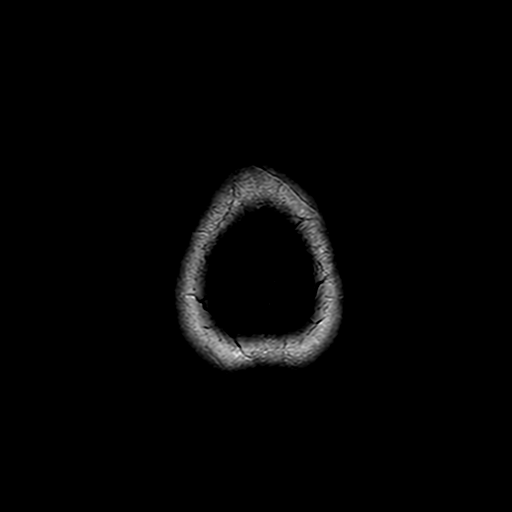

[Series 250: ADC · axial · 3.0mm · 0.94mm/px · z∈[-92,+61]mm · 4 of 52 slices shown (1 of 2)]
[im 1/52]
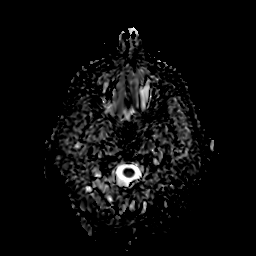
[im 18/52]
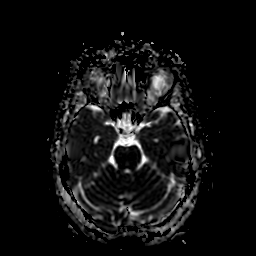
[im 35/52]
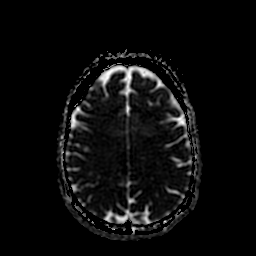
[im 52/52]
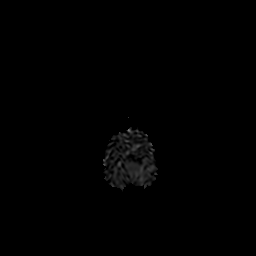

[Series 350: ADC · coronal · 4.0mm · 0.94mm/px · 3 of 38 slices shown (2 of 2)]
[im 1/38]
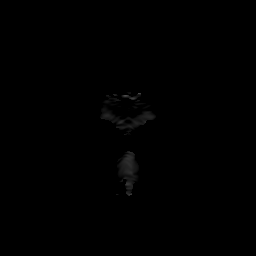
[im 19/38]
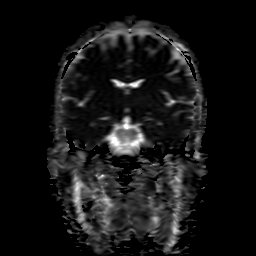
[im 38/38]
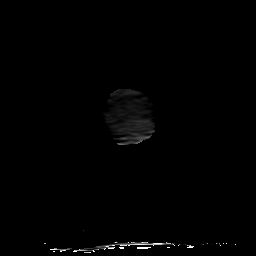

[23 of 48 positions shown; findings below may reference images not displayed]

FINDINGS: Brain: Normal cerebral volume. No restricted diffusion to suggest
acute infarction. No midline shift, mass effect, evidence of mass
lesion, ventriculomegaly, extra-axial collection or acute
intracranial hemorrhage. Cervicomedullary junction and pituitary are
within normal limits.

Gray and white matter signal is within normal limits throughout the
brain. Occasional dural calcifications, normal variant. No
encephalomalacia or chronic cerebral blood products.

No abnormal enhancement identified. No dural thickening.

Vascular: Major intracranial vascular flow voids are preserved. The
major dural venous sinuses are enhancing and appear to be patent.

Skull and upper cervical spine: Negative upper cervical spine.
Visualized bone marrow signal is within normal limits.

Sinuses/Orbits: Negative orbits. Minimal to mild paranasal sinus
mucosal thickening. No sinus fluid levels.

Other: Mastoids are well pneumatized. Visible internal auditory
structures appear normal. Scalp and face soft tissues appear
negative.
IMPRESSION: Normal MRI appearance of the brain.

## 2019-08-31 IMAGING — MR MR CERVICAL SPINE WO/W CM
4 of 8 series · 19 of 48 positions shown · IV contrast (gadavist)
Comparison: Brain MRI today reported separately.

CLINICAL DATA: 43-year-old female with back pain, saddle
anesthesia, urinary incontinence after heavy lifting. Unrevealing
noncontrast lumbar MRI, symptom etiology unclear at this time.

EXAM:
MRI CERVICAL SPINE WITHOUT AND WITH CONTRAST
TECHNIQUE: Multiplanar and multiecho pulse sequences of the cervical spine, to
include the craniocervical junction and cervicothoracic junction,
were obtained without and with intravenous contrast.
CONTRAST:  7mL GADAVIST GADOBUTROL 1 MMOL/ML IV SOLN in conjunction
with contrast enhanced imaging of the brain reported separately.

[Series 10: T2 · sagittal · 3.0mm · 0.43mm/px · 4 of 16 slices shown (1 of 2)]
[im 1/16]
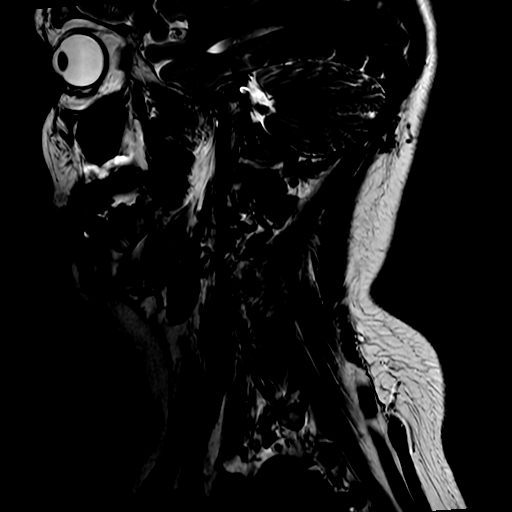
[im 6/16]
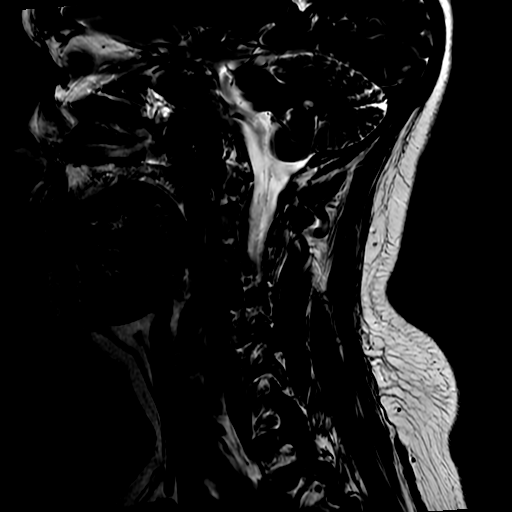
[im 11/16]
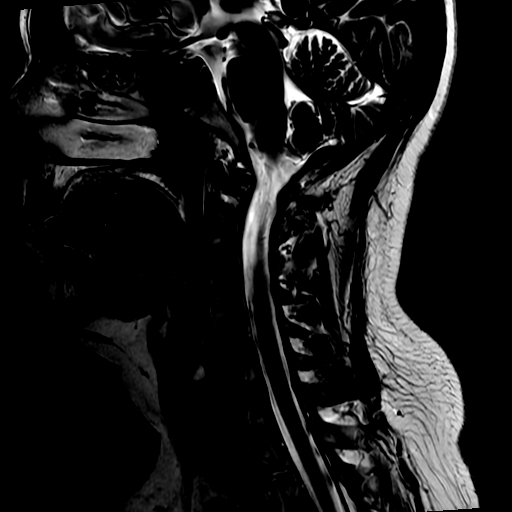
[im 16/16]
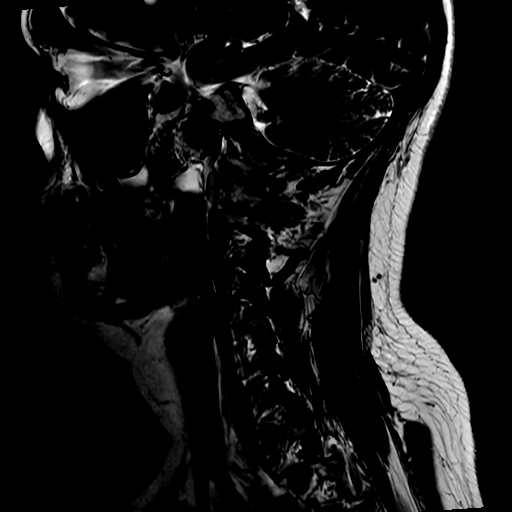

[Series 14: T2 · axial · 3.0mm · 0.35mm/px · z∈[-238,-153]mm · 6 of 27 slices shown (2 of 2)]
[im 1/27]
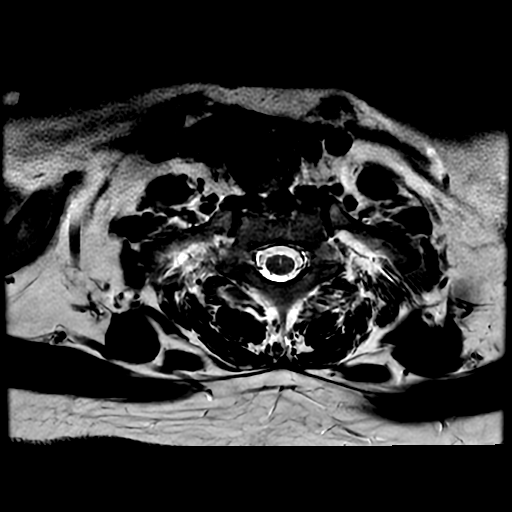
[im 6/27]
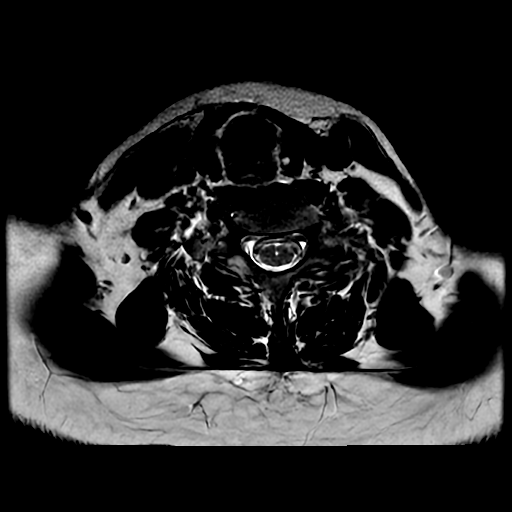
[im 11/27]
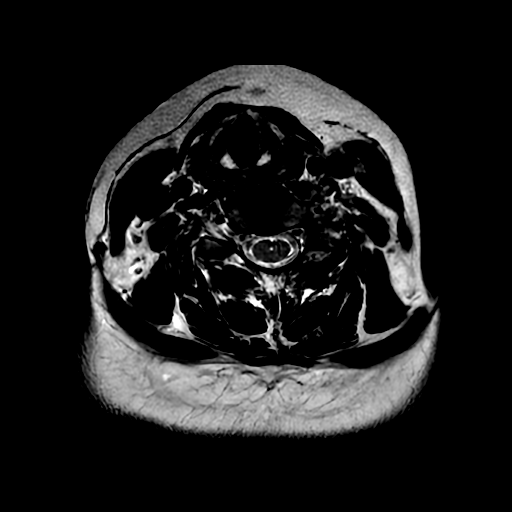
[im 16/27]
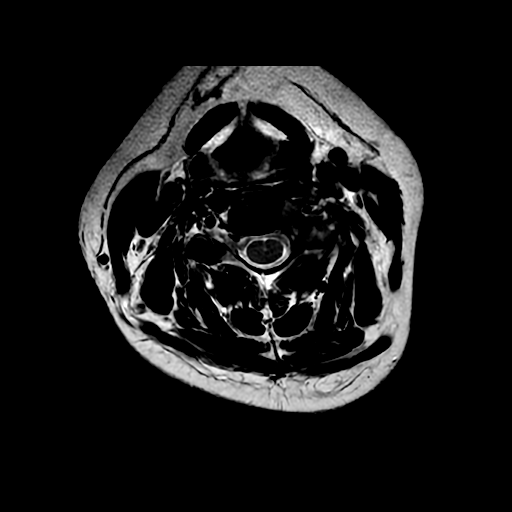
[im 21/27]
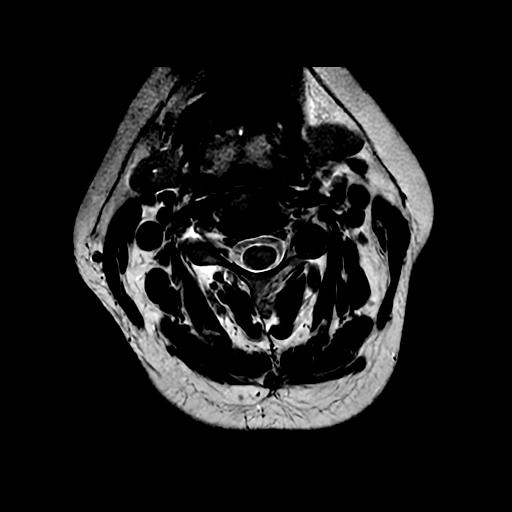
[im 27/27]
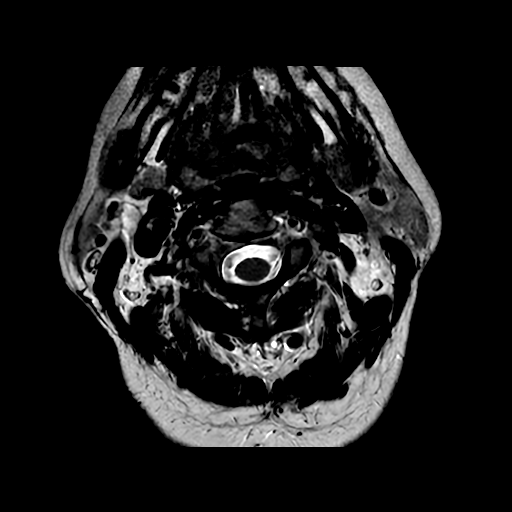

[Series 15: T1 · axial · non-contrast · 3.0mm · 0.35mm/px · z∈[-238,-153]mm · 6 of 27 slices shown]
[im 1/27]
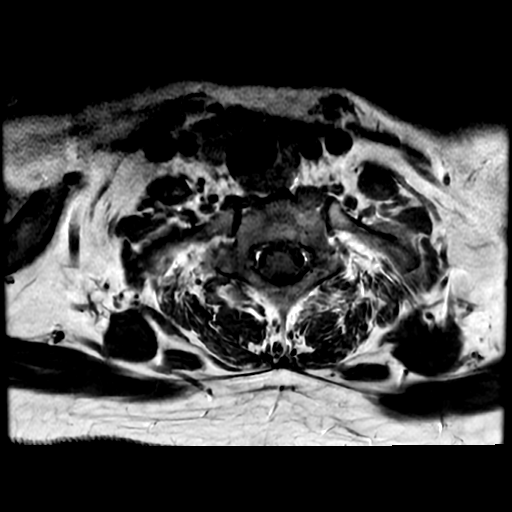
[im 6/27]
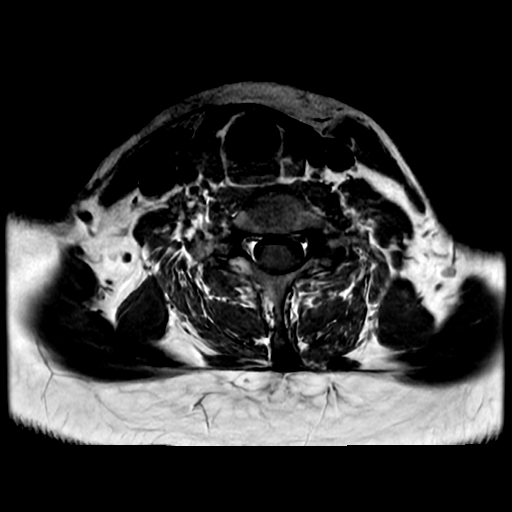
[im 11/27]
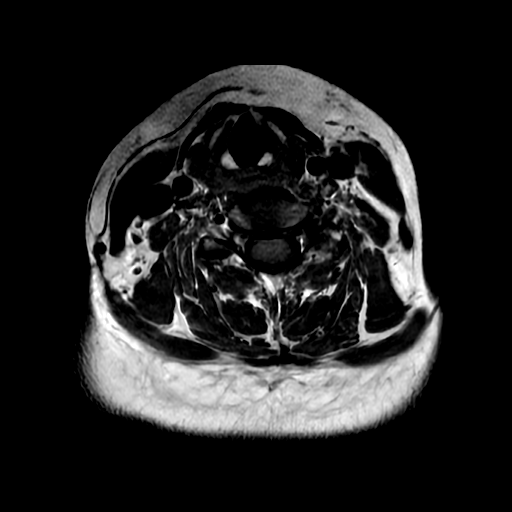
[im 16/27]
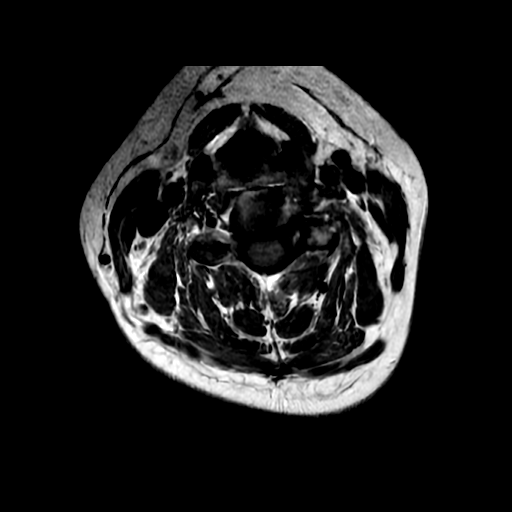
[im 21/27]
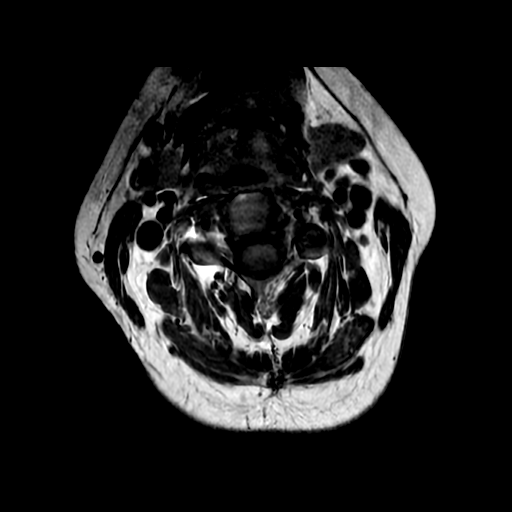
[im 27/27]
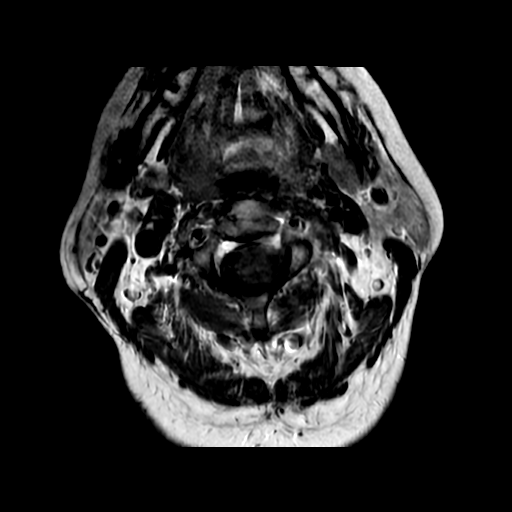

[Series 35: T1 fat-sat post-contrast · sagittal · 3.0mm · 0.43mm/px · 3 of 16 slices shown]
[im 1/16]
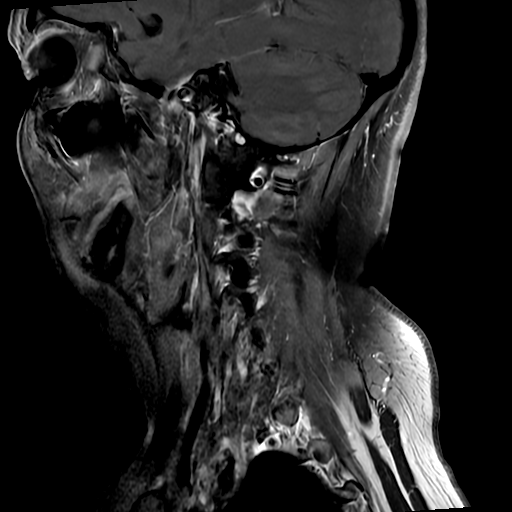
[im 8/16]
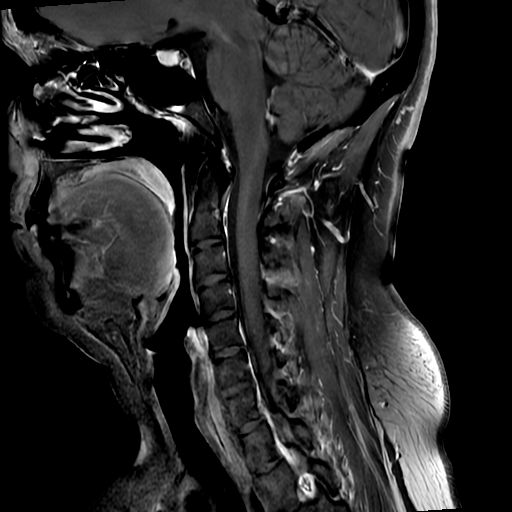
[im 16/16]
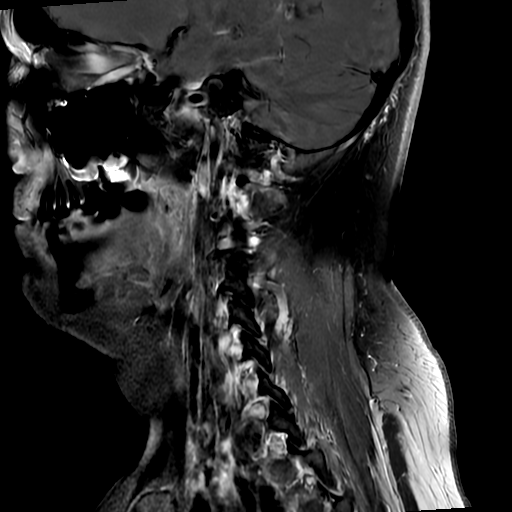

[19 of 48 positions shown; findings below may reference images not displayed]

FINDINGS: Alignment: Mild straightening of cervical lordosis. No
spondylolisthesis.

Vertebrae: No marrow edema or evidence of acute osseous abnormality.
Visualized bone marrow signal is within normal limits.

Cord: Cervical spinal cord appears within normal limits. There is
minimal prominence of the central spinal canal (normal variant
series 10, image 10 and series 14, image 20, not visible on T1
images). No abnormal intradural enhancement. No dural thickening.

Posterior Fossa, vertebral arteries, paraspinal tissues:
Cervicomedullary junction is within normal limits. Negative visible
brain parenchyma, also reported separately today.

Preserved major vascular flow voids in the neck. Slightly dominant
right vertebral artery. Negative visible neck soft tissues. Negative
visible right lung apex.

Disc levels:

C2-C3:  Negative.

C3-C4:  Negative.

C4-C5:  Negative.

C5-C6:  Negative.

C6-C7: Minimal disc desiccation and disc bulge. Minimal endplate
spurring. However, no spinal or convincing neural foraminal
stenosis.

C7-T1: Borderline to mild facet hypertrophy. Otherwise negative. No
stenosis.
IMPRESSION: Normal for age MRI appearance of the cervical spine, and normal
cervical spinal cord. No spinal stenosis or neural impingement.

## 2019-08-31 IMAGING — MR MR THORACIC SPINE WO/W CM
6 of 14 series · 18 of 48 positions shown · IV contrast (gadavist)
Comparison: Brain and cervical spine MRI today reported separately.
Lumbar MRI yesterday.

CLINICAL DATA: 43-year-old female with back pain, saddle
anesthesia, urinary incontinence after heavy lifting. Unrevealing
noncontrast lumbar MRI, symptom etiology unclear at this time.

EXAM:
MRI THORACIC WITHOUT AND WITH CONTRAST
TECHNIQUE: Multiplanar and multiecho pulse sequences of the thoracic spine were
obtained without and with intravenous contrast.
CONTRAST:  7mL GADAVIST GADOBUTROL 1 MMOL/ML IV SOLN in conjunction
with contrast enhanced imaging of the brain and cervical spine
reported separately.

[Series 17: T1 · sagittal · 3.0mm · 0.90mm/px · 3 of 16 slices shown (1 of 3)]
[im 1/16]
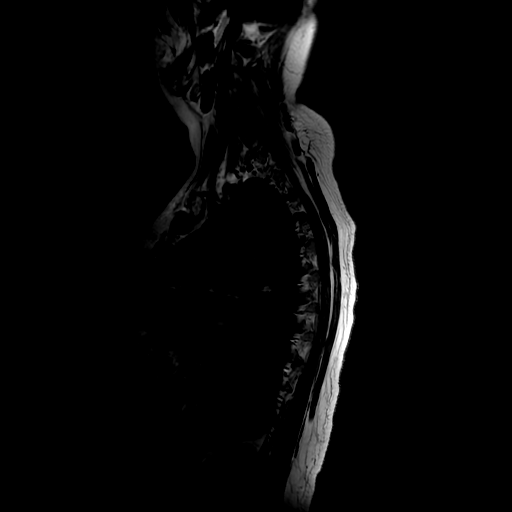
[im 8/16]
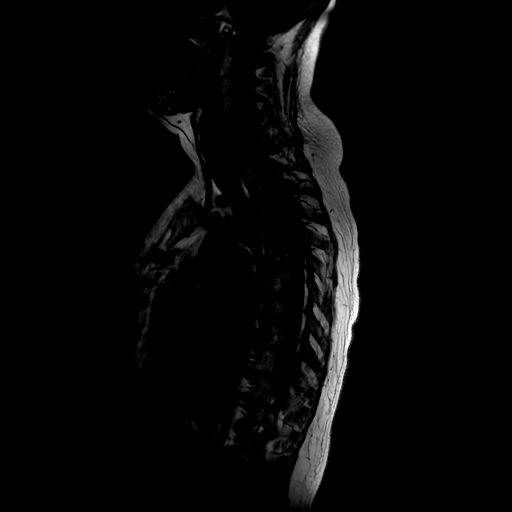
[im 16/16]
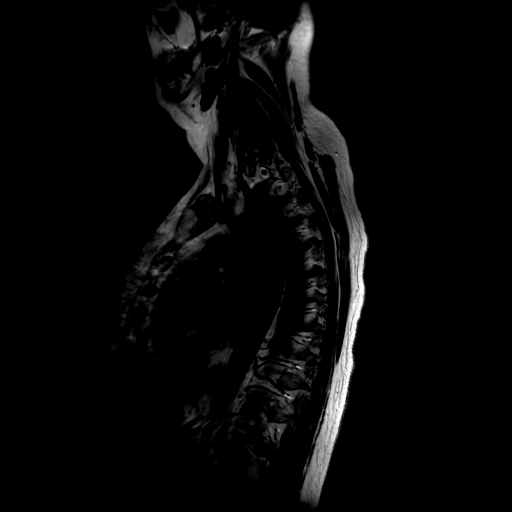

[Series 18: T2 · sagittal · 3.0mm · 0.66mm/px · 4 of 20 slices shown (1 of 3)]
[im 1/20]
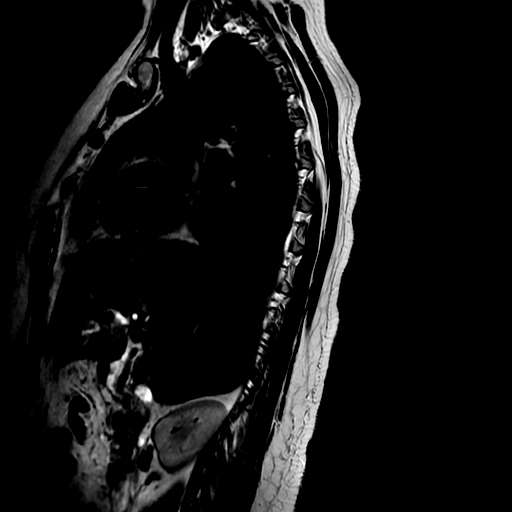
[im 7/20]
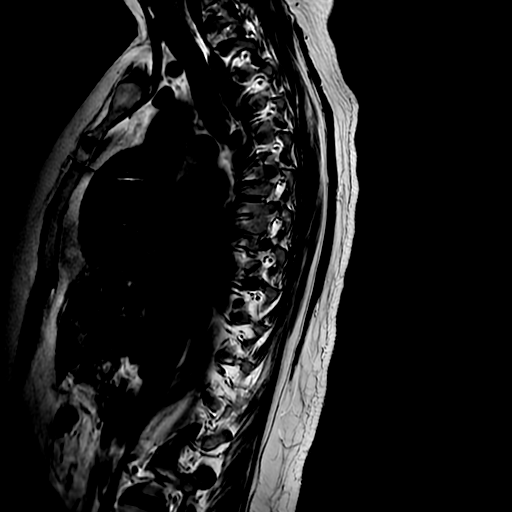
[im 13/20]
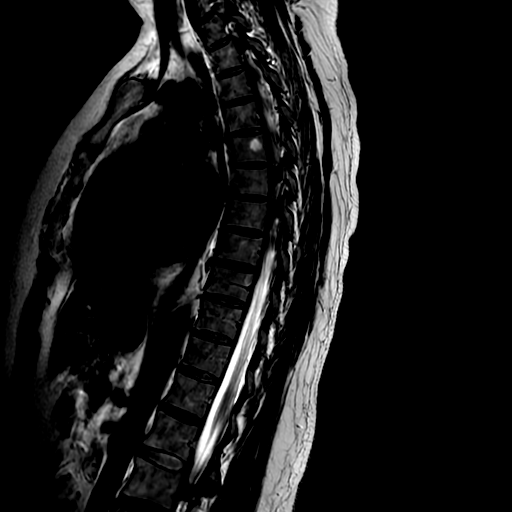
[im 20/20]
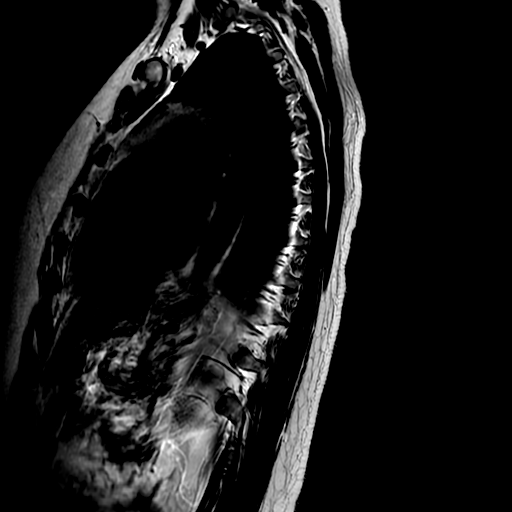

[Series 20: T1 · sagittal · 3.0mm · 0.66mm/px · 3 of 20 slices shown (2 of 3)]
[im 1/20]
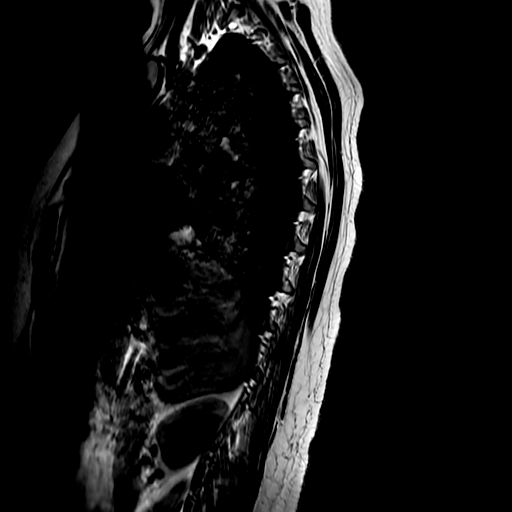
[im 10/20]
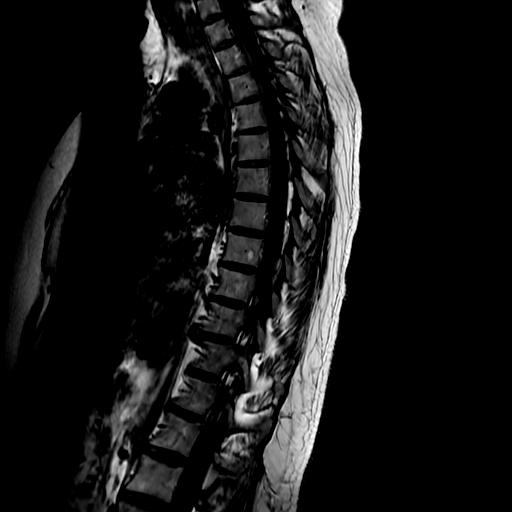
[im 20/20]
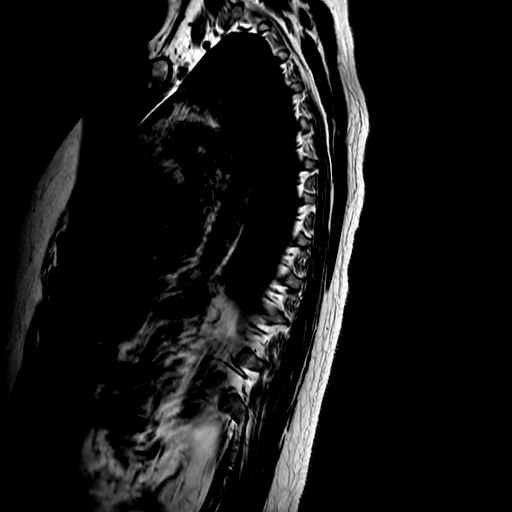

[Series 21: T2 · axial · 4.0mm · 0.39mm/px · z∈[-359,-250]mm · 3 of 23 slices shown (2 of 3)]
[im 1/23]
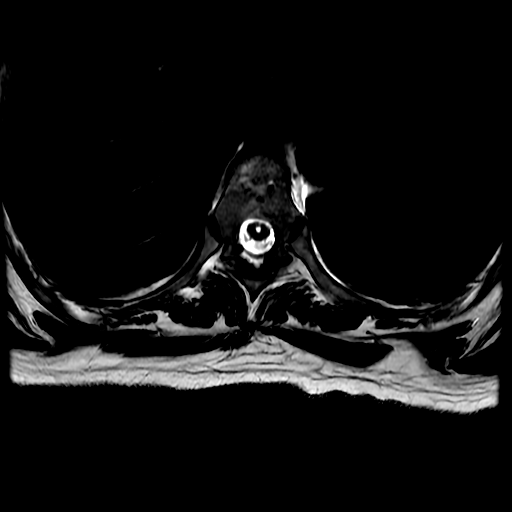
[im 12/23]
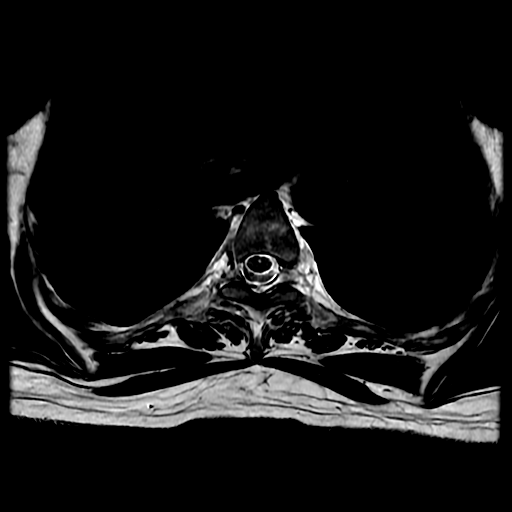
[im 23/23]
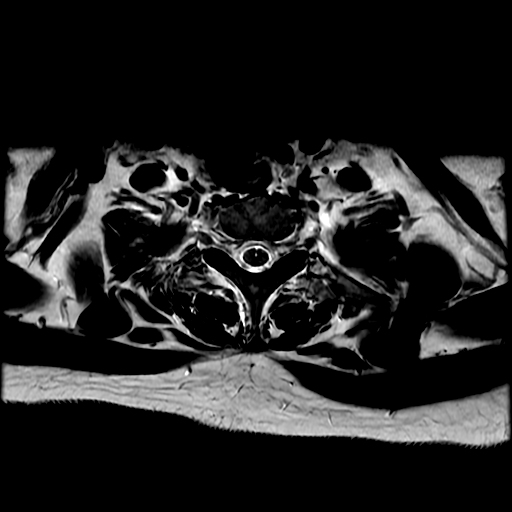

[Series 22: T2 · axial · 4.0mm · 0.39mm/px · z∈[-479,-321]mm · 4 of 30 slices shown (3 of 3)]
[im 1/30]
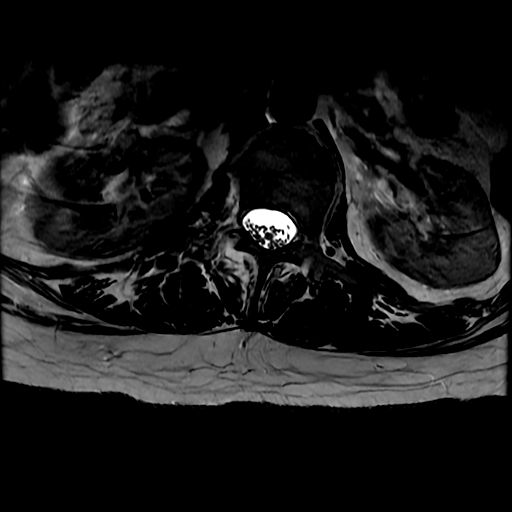
[im 10/30]
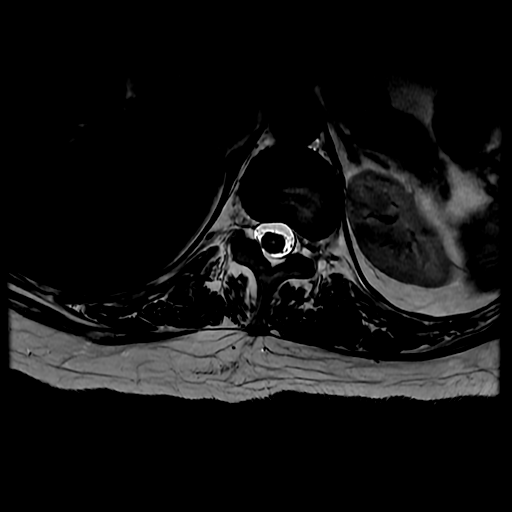
[im 20/30]
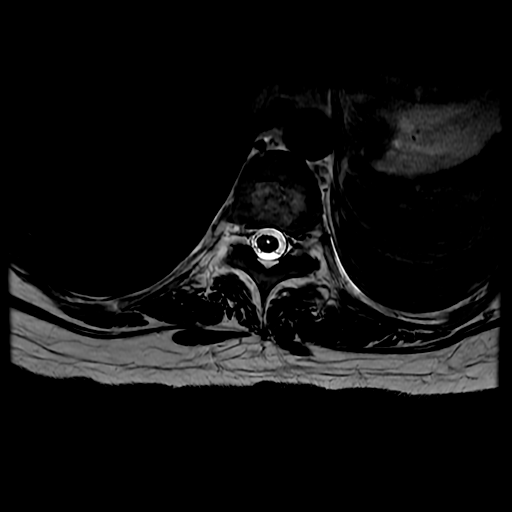
[im 30/30]
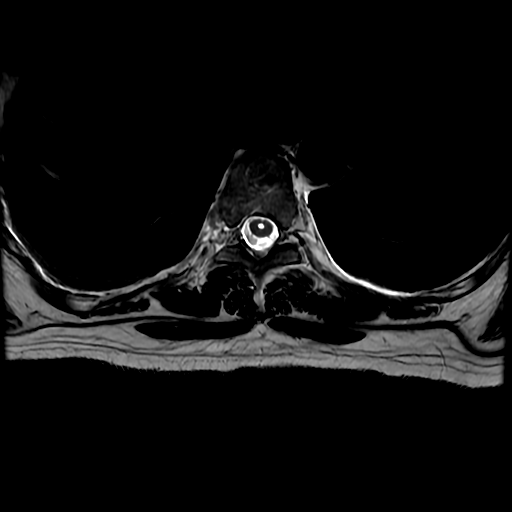

[Series 27: T1 · sagittal · 3.0mm · 0.90mm/px · 1 of 16 slices shown (3 of 3)]
[im 1/16]
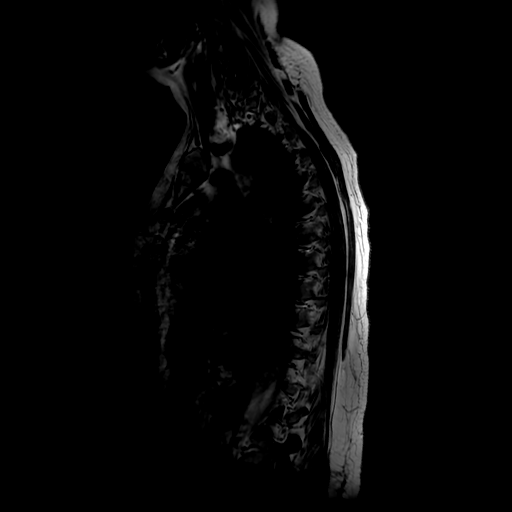

[18 of 48 positions shown; findings below may reference images not displayed]

FINDINGS: Limited cervical spine imaging:  Reported separately today.

Thoracic spine segmentation: Appears to be normal and concordant
with the lumbar numbering yesterday.

Alignment:  Normal thoracic kyphosis.

Vertebrae: Visualized bone marrow signal is within normal limits. No
marrow edema or evidence of acute osseous abnormality. Occasional
small benign thoracic vertebral hemangiomas.

Cord: Capacious thoracic spinal canal. From the cervicothoracic
junction to the T5 and T6 level there is normal spinal cord signal
and morphology; at some of these levels prominence of the central
spinal canal is again noted as suspected in the cervical spine.
However, central cord T2/STIR hyperintensity becomes abnormal below
T6 and is maximal/visible on sagittal T1 weighted [HOSPITAL] the T8
level, up to 3 mm diameter. But this quickly tapers below T8, such
that the T10 through conus levels remain normal.

There is no associated spinal cord expansion or definite cord edema.
There is no abnormal spinal cord or intradural enhancement. No dural
thickening.

Paraspinal and other soft tissues: Negative visible the thoracic
inlet. There are trace layering pleural effusions (series 24, image
2), with otherwise negative visible thoracic viscera. Negative
visible upper abdominal viscera.

Disc levels:

As in the cervical spine there are no age advanced thoracic spine
degenerative changes. There is occasional minor disc desiccation and
disc bulging (T6-T7), but no disc herniation, no spinal stenosis, no
thoracic neural foraminal stenosis.
IMPRESSION: 1. Positive for Syringohydromyelia of the thoracic spinal cord
centered at the T8 level.
Intermittently both above and below that level more physiologic
appearing prominence of the central spinal canal is noted, but no
spinal cord mass or abnormal enhancement is identified. The
underlying spinal canal is capacious.

2. Otherwise normal for age MRI appearance of the thoracic spine.

3. Trace layering pleural effusions.

## 2019-08-31 MED ORDER — GADOBUTROL 1 MMOL/ML IV SOLN
7.0000 mL | Freq: Once | INTRAVENOUS | Status: AC | PRN
  Administered 2019-08-31: 7 mL via INTRAVENOUS

## 2019-08-31 MED ORDER — METHYLPREDNISOLONE SODIUM SUCC 125 MG IJ SOLR
40.0000 mg | INTRAMUSCULAR | Status: DC
  Administered 2019-08-31: 40 mg via INTRAVENOUS
  Filled 2019-08-31: qty 2

## 2019-08-31 MED ORDER — CHLORHEXIDINE GLUCONATE CLOTH 2 % EX PADS
6.0000 | MEDICATED_PAD | Freq: Every day | CUTANEOUS | Status: DC
  Administered 2019-08-31 – 2019-09-01 (×2): 6 via TOPICAL
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
## 2019-08-31 NOTE — Plan of Care (Signed)
  Problem: Education: Goal: Knowledge of General Education information will improve Description: Including pain rating scale, medication(s)/side effects and non-pharmacologic comfort measures 08/31/2019 1446 by Melina Schools, RN Outcome: Progressing 08/31/2019 0738 by Melina Schools, RN Outcome: Progressing   Problem: Elimination: Goal: Will not experience complications related to bowel motility Outcome: Progressing   Problem: Pain Managment: Goal: General experience of comfort will improve 08/31/2019 1446 by Melina Schools, RN Outcome: Progressing 08/31/2019 0738 by Melina Schools, RN Outcome: Progressing   Problem: Safety: Goal: Ability to remain free from injury will improve 08/31/2019 1446 by Melina Schools, RN Outcome: Progressing 08/31/2019 0738 by Melina Schools, RN Outcome: Progressing

## 2019-08-31 NOTE — Plan of Care (Signed)

## 2019-08-31 NOTE — Progress Notes (Signed)
PROGRESS NOTE    Janice Ibarra  [id number] DOB: [birth year] DOA: 08/30/2019 PCP: Manon Hilding, MD    Brief Narrative:  44 y.o. female with history of GERD and nephrolithiasis presents to the ED with severe low back pain since 08/25/2019 after moving a piece of furniture -She reaggravated the back pain on 08/28/2019 after being startled and jumping awkwardly - EDP called with concerns about Possible Spinal Cord Compression--saddle anesthesia, urinary incontinence, constipation, rectal sphincter tone problems, back pain and leg weakness -EDP discussed case with on-call neurosurgeon Dr. Duffy Rhody who reviewed patient MRI and chart with EDP and recommended steroids but none neurosurgical intervention at this time -MRI of lumbar spine without acute cord compression  -UA mostly unremarkable -Additional history obtained from patient's husband at bedside -WBC 14.1 but patient has been on steroids for back pain -Creatinine 0.6 with potassium of 3.3 and glucose of 113  Assessment & Plan:   Principal Problem:   Possible Lumbar cord compression (HCC) Active Problems:   Low back pain-Possible Spinal Cord Compression--saddle anesthesia, urinary incontinence, constipation, rectal sphincter tone problems, back pain and leg weakness   GERD (gastroesophageal reflux disease)  1)Possible Spinal Cord Compression--patient presented with back pain since 08/25/2019 associated with saddle anesthesia (Lt inner thigh--please see depiction in EDP note from 08/30/2019) , urinary incontinence, constipation, rectal sphincter tone problems and lt leg weakness- --MRI lumbar spine without definite cord compression -EDP discussed case and reviewed MRI with Dr. Duffy Rhody neurosurgeon--- recommended steroids and no neurosurgical intervention at this time -Patient received IV Solu-Medrol 1 g on 5/19/202 -Given another 40mg  IV solumedrol 5/20 -Have consulted Neurology, appreciate input. Recommendation to  continue with PT with follow up MRI brain, cervical, thoracic spines ordered and pending -As needed muscle relaxant and opiates as ordered  2)H/o GERD--Continued on protonix while on high-dose Solu-Medrol  3)Leukocytosis--- spread to steroid-induced, it may persist due to steroids, no evidence of acute infection at this time -UA reviewed, neg  DVT prophylaxis: Heparin subq Code Status: Full Family Communication: Pt in room, family not at bedside  Status is: Inpatient  Remains inpatient appropriate because:Ongoing diagnostic testing needed not appropriate for outpatient work up   Dispo: The patient is from: Home              Anticipated d/c is to: Home              Anticipated d/c date is: 2 days              Patient currently is not medically stable to d/c.   Consultants:   Neurology  Procedures:     Antimicrobials: Anti-infectives (From admission, onward)   None       Subjective: Still complaining of bowel and bladder incontinence as well as anterior L thigh numbness  Objective: Vitals:   08/30/19 2026 08/31/19 0452 08/31/19 0734 08/31/19 0745  BP: 116/78 124/84 113/75   Pulse: 63 (!) 58 67   Resp: 15 14 16 20   Temp: 98.4 F (36.9 C) 99 F (37.2 C) 98.6 F (37 C)   TempSrc: Oral Oral Oral   SpO2: 96% 96% 96%   Weight:      Height:        Intake/Output Summary (Last 24 hours) at 08/31/2019 1520 Last data filed at 08/31/2019 1100 Gross per 24 hour  Intake 480 ml  Output 1150 ml  Net -670 ml   Filed Weights   08/30/19 1142  Weight: 74.8 kg    Examination:  General exam: Appears calm and comfortable  Respiratory system: Clear to auscultation. Respiratory effort normal. Cardiovascular system: S1 & S2 heard, Regular Gastrointestinal system: Abdomen is nondistended, soft and nontender. No organomegaly or masses felt. Normal bowel sounds heard. Central nervous system: Alert and oriented. No focal neurological deficits. Extremities: Symmetric 5 x 5  power. Skin: No rashes, lesions Psychiatry: Judgement and insight appear normal. Mood & affect appropriate.   Data Reviewed: I have personally reviewed following labs and imaging studies  CBC: Recent Labs  Lab 08/30/19 1221 08/31/19 0158  WBC 14.1* 11.5*  NEUTROABS 11.1*  --   HGB 12.5 12.6  HCT 39.1 38.8  MCV 87.7 87.2  PLT 308 [id number]   Basic Metabolic Panel: Recent Labs  Lab 08/30/19 1221 08/31/19 0158  NA 138 136  K 3.3* 4.8  CL 103 102  CO2 25 23  GLUCOSE 113* 146*  BUN 17 18  CREATININE 0.64 0.72  CALCIUM 9.5 9.5   GFR: Estimated Creatinine Clearance: 91.8 mL/min (by C-G formula based on SCr of 0.72 mg/dL). Liver Function Tests: No results for input(s): AST, ALT, ALKPHOS, BILITOT, PROT, ALBUMIN in the last 168 hours. No results for input(s): LIPASE, AMYLASE in the last 168 hours. No results for input(s): AMMONIA in the last 168 hours. Coagulation Profile: No results for input(s): INR, PROTIME in the last 168 hours. Cardiac Enzymes: No results for input(s): CKTOTAL, CKMB, CKMBINDEX, TROPONINI in the last 168 hours. BNP (last 3 results) No results for input(s): PROBNP in the last 8760 hours. HbA1C: No results for input(s): HGBA1C in the last 72 hours. CBG: Recent Labs  Lab 08/30/19 1746 08/30/19 2356 08/31/19 0601 08/31/19 1157  GLUCAP 106* 158* 135* 100*   Lipid Profile: No results for input(s): CHOL, HDL, LDLCALC, TRIG, CHOLHDL, LDLDIRECT in the last 72 hours. Thyroid Function Tests: No results for input(s): TSH, T4TOTAL, FREET4, T3FREE, THYROIDAB in the last 72 hours. Anemia Panel: No results for input(s): VITAMINB12, FOLATE, FERRITIN, TIBC, IRON, RETICCTPCT in the last 72 hours. Sepsis Labs: No results for input(s): PROCALCITON, LATICACIDVEN in the last 168 hours.  Recent Results (from the past 240 hour(s))  Urine culture     Status: None   Collection Time: 08/30/19  1:33 PM   Specimen: Urine, Catheterized  Result Value Ref Range Status    Specimen Description   Final    URINE, CATHETERIZED Performed at [facility], [address], [place], [place] [address]    Special Requests   Final    NONE Performed at [facility], [address] [place], [place] [address]    Culture   Final    NO GROWTH Performed at [facility], [place] [address] [place], [place] [address]    Report Status 08/31/2019 FINAL  Final  SARS Coronavirus 2 by RT PCR (hospital order, performed in [facility] hospital lab) Nasopharyngeal Nasopharyngeal Swab     Status: None   Collection Time: 08/30/19  3:05 PM   Specimen: Nasopharyngeal Swab  Result Value Ref Range Status   SARS Coronavirus 2 NEGATIVE NEGATIVE Final    Comment: (NOTE) SARS-CoV-2 target nucleic acids are NOT DETECTED. The SARS-CoV-2 RNA is generally detectable in upper and lower respiratory specimens during the acute phase of infection. The lowest concentration of SARS-CoV-2 viral copies this assay can detect is 250 copies / mL. A negative result does not preclude SARS-CoV-2 infection and should not be used as the sole basis for treatment or other patient management decisions.  A negative result  may occur with improper specimen collection / handling, submission of specimen other than nasopharyngeal swab, presence of viral mutation(s) within the areas targeted by this assay, and inadequate number of viral copies (<250 copies / mL). A negative result must be combined with clinical observations, patient history, and epidemiological information. Fact Sheet for Patients:   StrictlyIdeas.no Fact Sheet for Healthcare Providers: BankingDealers.co.za This test is not yet approved or cleared  by the [place] FDA and has been authorized for detection and/or diagnosis of SARS-CoV-2 by FDA under an Emergency Use Authorization (EUA).  This EUA will remain in effect (meaning this test can be used) for the duration of the COVID-19 declaration  under Section 564(b)(1) of the Act, 21 U.S.C. section 360bbb-3(b)(1), unless the authorization is terminated or revoked sooner. Performed at [facility], [address], [place], [place] [address]      Radiology Studies: MR LUMBAR SPINE WO CONTRAST  Result Date: 08/30/2019 CLINICAL DATA:  Low back pain. Cauda equina syndrome suspected. HAH high EXAM: MRI LUMBAR SPINE WITHOUT CONTRAST TECHNIQUE: Multiplanar, multisequence MR imaging of the lumbar spine was performed. No intravenous contrast was administered. COMPARISON:  None. FINDINGS: Segmentation:  Standard. Alignment:  Physiologic. Vertebrae:  No fracture, evidence of discitis, or bone lesion. Conus medullaris and cauda equina: Conus extends to the L1 level. Conus and cauda equina appear normal. Paraspinal and other soft tissues: Negative. Disc levels: T12-L1: No spinal canal or neural foraminal stenosis. L1-2: No spinal canal or neural foraminal stenosis. L2-3: No spinal canal or neural foraminal stenosis. L3-4: No spinal canal or neural foraminal stenosis. L4-5: No spinal canal or neural foraminal stenosis. L5-S1: Small left foraminal/extraforaminal disc protrusion resulting in minimal narrowing of the left neural foramen. No significant spinal canal stenosis. IMPRESSION: 1. No acute abnormality of the lumbar spine. No cauda equina compression. 2. Tiny left foraminal/extraforaminal disc protrusion at L5-S1 resulting in minimal narrowing of the left neural foramen. Electronically Signed   By: Pedro Earls M.D.   On: 08/30/2019 13:07    Scheduled Meds: . Chlorhexidine Gluconate Cloth  6 each Topical Daily  . heparin  5,000 Units Subcutaneous Q8H  . methocarbamol  750 mg Oral QID  . methylPREDNISolone (SOLU-MEDROL) injection  40 mg Intravenous Q24H  . pantoprazole  40 mg Oral Daily  . polyethylene glycol  17 g Oral Daily  . senna-docusate  2 tablet Oral QHS  . sodium chloride flush  3 mL Intravenous Q12H   Continuous  Infusions: . sodium chloride       LOS: 1 day   Marylu Lund, MD [facility] Pager On Amion  If 7PM-7AM, please contact night-coverage 08/31/2019, 3:20 PM

## 2019-08-31 NOTE — Plan of Care (Signed)

## 2019-09-01 DIAGNOSIS — G950 Syringomyelia and syringobulbia: Secondary | ICD-10-CM

## 2019-09-01 NOTE — Discharge Summary (Addendum)
Physician Discharge Summary  Janice Ibarra [id number] DOB: 01-08-1976 DOA: 08/30/2019  PCP: Manon Hilding, MD  Admit date: 08/30/2019 Discharge date: 09/01/2019  Admitted From: Home Disposition:  Home  Recommendations for Outpatient Follow-up:  1. Follow up with PCP in 2-3 weeks 2. Follow up with Neurosurgery as scheduled 3. Follow up with outpatient PT/OT  Discharge Condition:Stable CODE STATUS:Full Diet recommendation: Regular   Brief/Interim Summary: 44 y.o.femalewith history of GERD and nephrolithiasis presents to the ED with severe low back pain since 5/14/2021after moving a piece of furniture -She reaggravated the back pain on 08/28/2019 after being startled and jumping awkwardly - EDP called with concerns aboutPossible Spinal Cord Compression--saddle anesthesia, urinary incontinence, constipation, rectal sphincter tone problems, back pain and leg weakness -EDP discussed case with on-call neurosurgeon Dr. 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 Duffy Rhody  neurosurgeon---recommended steroids and no neurosurgical intervention at this time -Have consulted Neurology, appreciate input. Pt underwent follow up brain, cervical, and thoracic MRI with finding notable for T8 syringohyodromyelia -Have discussed case with Neurology who recommended discussing with Neurosurgery. Have discussed case with Dr. Marcello Moores, who recommended no surgical intervention and recommendation for continued PT and close outpatient follow up -Patient seen by PT, recommendation for outpatient PT  2)H/o GERD--Remained stable  3)Leukocytosis---spread to steroid-induced, it may persist due to steroids, no evidence of acute infection at this time -UA reviewed, neg   Discharge Instructions  Discharge Instructions    Ambulatory referral to Occupational Therapy   Complete by: As directed    Ambulatory referral to Physical Therapy   Complete by: As directed      Allergies as of 09/01/2019      Reactions   Penicillins Anaphylaxis   Erythromycin Other (See Comments)   Pt states this was a childhood allergy but has taken other MYCIN drugs since      Medication List    STOP taking these medications   lubiprostone 8 MCG capsule Commonly known as: Amitiza     TAKE these medications   cyclobenzaprine 10 MG tablet Commonly known as: FLEXERIL Take 1 tablet by mouth every evening.   HYDROcodone-acetaminophen 10-325 MG tablet Commonly known as: NORCO Take 1 tablet by mouth 4 (four) times daily as needed.   ibuprofen 200 MG tablet Commonly known as: ADVIL Take 200 mg every 8 (eight) hours as needed by mouth for headache.   predniSONE 20 MG tablet Commonly known as: DELTASONE TAKE TWO TABLETS BY MOUTH FOR SEVEN DAYS, THEN TAKE 1 TABLET FOR SEVEN DAYS      Follow-up Information    [place] [facility] Follow up.   Why: Referral made for outpatient PT and OT. Office will call and arrange appointment time Contact  information: [facility]  [place] [facility] [place]  [place] [place] [address] [place], [place] [address]       Manon Hilding, MD. Schedule an appointment as soon as possible for a visit in 2 week(s).   Specialty: Family Medicine Contact information: [place] [address] [phone]        Vallarie Mare, MD. Schedule an appointment as soon as possible for a visit in 1 month(s).   Specialty: Neurosurgery Contact information: [address] [place] [address] [phone]          Allergies  Allergen Reactions  . Penicillins Anaphylaxis  . Erythromycin Other (See Comments)    Pt states this was a childhood allergy but has taken other MYCIN drugs since    Consultations:  Neurology  Discussed case with Neurosurgeon   Procedures/Studies: MR BRAIN W WO CONTRAST  Result Date: 08/31/2019 CLINICAL DATA:  44 year old female with back pain, saddle anesthesia, urinary incontinence after heavy lifting. Unrevealing noncontrast lumbar MRI, symptom etiology unclear at this time. EXAM: MRI HEAD WITHOUT AND WITH CONTRAST TECHNIQUE: Multiplanar, multiecho pulse sequences of the brain and surrounding structures were obtained without and with intravenous contrast. CONTRAST:  56mL GADAVIST GADOBUTROL 1 MMOL/ML IV SOLN COMPARISON:  None. FINDINGS: Brain: Normal cerebral volume. No restricted diffusion to suggest acute infarction. No midline shift, mass effect, evidence of mass lesion, ventriculomegaly, extra-axial collection or acute intracranial hemorrhage. Cervicomedullary junction and pituitary are within normal limits. Pearline Cables and white matter signal is within normal limits throughout the brain. Occasional dural calcifications, normal variant. No encephalomalacia or chronic cerebral blood products. No abnormal enhancement identified. No dural thickening. Vascular: Major intracranial vascular flow voids are preserved. The major dural  venous sinuses are enhancing and appear to be patent. Skull and upper cervical spine: Negative upper cervical spine. Visualized bone marrow signal is within normal limits. Sinuses/Orbits: Negative orbits. Minimal to mild paranasal sinus mucosal thickening. No sinus fluid levels. Other: Mastoids are well pneumatized. Visible internal auditory structures appear normal. Scalp and face soft tissues appear negative. IMPRESSION: Normal MRI appearance of the brain. Electronically Signed   By: Genevie Ann M.D.   On: 08/31/2019 15:55   MR LUMBAR SPINE WO CONTRAST  Result Date: 08/30/2019 CLINICAL DATA:  Low back pain. Cauda equina syndrome suspected. HAH high EXAM: MRI LUMBAR SPINE WITHOUT CONTRAST TECHNIQUE: Multiplanar, multisequence MR imaging of the lumbar spine was performed. No intravenous contrast was administered. COMPARISON:  None. FINDINGS: Segmentation:  Standard. Alignment:  Physiologic. Vertebrae:  No fracture, evidence of discitis, or bone lesion. Conus medullaris and cauda equina: Conus extends to the L1 level. Conus and cauda equina appear normal. Paraspinal and other soft tissues: Negative. Disc levels: T12-L1: No spinal canal or neural foraminal stenosis. L1-2: No spinal canal or neural foraminal stenosis. L2-3: No spinal canal or neural foraminal stenosis. L3-4: No spinal canal or neural foraminal stenosis. L4-5: No spinal canal or neural foraminal stenosis. L5-S1: Small left foraminal/extraforaminal disc protrusion resulting in minimal narrowing of the left neural foramen. No significant spinal canal stenosis. IMPRESSION: 1. No acute abnormality of the lumbar spine. No cauda equina compression. 2. Tiny left foraminal/extraforaminal disc protrusion at L5-S1 resulting in minimal narrowing of the left neural foramen. Electronically Signed   By: Pedro Earls M.D.   On: 08/30/2019 13:07   MR CERVICAL SPINE W WO CONTRAST  Result Date: 08/31/2019 CLINICAL DATA:  44 year old female with back  pain, saddle anesthesia, urinary incontinence after  heavy lifting. Unrevealing noncontrast lumbar MRI, symptom etiology unclear at this time. EXAM: MRI CERVICAL SPINE WITHOUT AND WITH CONTRAST TECHNIQUE: Multiplanar and multiecho pulse sequences of the cervical spine, to include the craniocervical junction and cervicothoracic junction, were obtained without and with intravenous contrast. CONTRAST:  79mL GADAVIST GADOBUTROL 1 MMOL/ML IV SOLN in conjunction with contrast enhanced imaging of the brain reported separately. COMPARISON:  Brain MRI today reported separately. FINDINGS: Alignment: Mild straightening of cervical lordosis. No spondylolisthesis. Vertebrae: No marrow edema or evidence of acute osseous abnormality. Visualized bone marrow signal is within normal limits. Cord: Cervical spinal cord appears within normal limits. There is minimal prominence of the central spinal canal (normal variant series 10, image 10 and series 14, image 20, not visible on T1 images). No abnormal intradural enhancement. No dural thickening. Posterior Fossa, vertebral arteries, paraspinal tissues: Cervicomedullary junction is within normal limits. Negative visible brain parenchyma, also reported separately today. Preserved major vascular flow voids in the neck. Slightly dominant right vertebral artery. Negative visible neck soft tissues. Negative visible right lung apex. Disc levels: C2-C3:  Negative. C3-C4:  Negative. C4-C5:  Negative. C5-C6:  Negative. C6-C7: Minimal disc desiccation and disc bulge. Minimal endplate spurring. However, no spinal or convincing neural foraminal stenosis. C7-T1: Borderline to mild facet hypertrophy. Otherwise negative. No stenosis. IMPRESSION: Normal for age MRI appearance of the cervical spine, and normal cervical spinal cord. No spinal stenosis or neural impingement. Electronically Signed   By: Genevie Ann M.D.   On: 08/31/2019 15:59   MR THORACIC SPINE W WO CONTRAST  Result Date: 08/31/2019 CLINICAL  DATA:  44 year old female with back pain, saddle anesthesia, urinary incontinence after heavy lifting. Unrevealing noncontrast lumbar MRI, symptom etiology unclear at this time. EXAM: MRI THORACIC WITHOUT AND WITH CONTRAST TECHNIQUE: Multiplanar and multiecho pulse sequences of the thoracic spine were obtained without and with intravenous contrast. CONTRAST:  50mL GADAVIST GADOBUTROL 1 MMOL/ML IV SOLN in conjunction with contrast enhanced imaging of the brain and cervical spine reported separately. COMPARISON:  Brain and cervical spine MRI today reported separately. Lumbar MRI yesterday. FINDINGS: Limited cervical spine imaging:  Reported separately today. Thoracic spine segmentation: Appears to be normal and concordant with the lumbar numbering yesterday. Alignment:  Normal thoracic kyphosis. Vertebrae: Visualized bone marrow signal is within normal limits. No marrow edema or evidence of acute osseous abnormality. Occasional small benign thoracic vertebral hemangiomas. Cord: Capacious thoracic spinal canal. From the cervicothoracic junction to the T5 and T6 level there is normal spinal cord signal and morphology; at some of these levels prominence of the central spinal canal is again noted as suspected in the cervical spine. However, central cord T2/STIR hyperintensity becomes abnormal below T6 and is maximal/visible on sagittal T1 weighted imaging at the T8 level, up to 3 mm diameter. But this quickly tapers below T8, such that the T10 through conus levels remain normal. There is no associated spinal cord expansion or definite cord edema. There is no abnormal spinal cord or intradural enhancement. No dural thickening. Paraspinal and other soft tissues: Negative visible the thoracic inlet. There are trace layering pleural effusions (series 24, image 2), with otherwise negative visible thoracic viscera. Negative visible upper abdominal viscera. Disc levels: As in the cervical spine there are no age advanced thoracic  spine degenerative changes. There is occasional minor disc desiccation and disc bulging (T6-T7), but no disc herniation, no spinal stenosis, no thoracic neural foraminal stenosis. IMPRESSION: 1. Positive for Syringohydromyelia of the thoracic spinal cord centered at the T8  level. Intermittently both above and below that level more physiologic appearing prominence of the central spinal canal is noted, but no spinal cord mass or abnormal enhancement is identified. The underlying spinal canal is capacious. 2. Otherwise normal for age MRI appearance of the thoracic spine. 3. Trace layering pleural effusions. Electronically Signed   By: Genevie Ann M.D.   On: 08/31/2019 16:10    Subjective: Eager to go home  Discharge Exam: Vitals:   09/01/19 0930 09/01/19 1130  BP: 118/69 108/69  Pulse: 77 69  Resp: 14 14  Temp: 98.5 F (36.9 C) 98.1 F (36.7 C)  SpO2: 98% 98%   Vitals:   09/01/19 0732 09/01/19 0800 09/01/19 0930 09/01/19 1130  BP: 92/69 124/78 118/69 108/69  Pulse: 67 71 77 69  Resp: 15 14 14 14   Temp: 98.2 F (36.8 C) 98.2 F (36.8 C) 98.5 F (36.9 C) 98.1 F (36.7 C)  TempSrc:   Oral Oral  SpO2: 99% 98% 98% 98%  Weight:      Height:        General: Pt is alert, awake, not in acute distress Cardiovascular: RRR, S1/S2 +, no rubs, no gallops Respiratory: CTA bilaterally, no wheezing, no rhonchi Abdominal: Soft, NT, ND, bowel sounds + Extremities: no edema, no cyanosis   The results of significant diagnostics from this hospitalization (including imaging, microbiology, ancillary and laboratory) are listed below for reference.     Microbiology: Recent Results (from the past 240 hour(s))  Urine culture     Status: None   Collection Time: 08/30/19  1:33 PM   Specimen: Urine, Catheterized  Result Value Ref Range Status   Specimen Description   Final    URINE, CATHETERIZED Performed at [facility], [address] [place], [place] [address]    Special Requests   Final     NONE Performed at [facility], [address], [place], [place] [address]    Culture   Final    NO GROWTH Performed at [facility], [place] [address] [place], [place] [address]    Report Status 08/31/2019 FINAL  Final  SARS Coronavirus 2 by RT PCR (hospital order, performed in [facility] hospital lab) Nasopharyngeal Nasopharyngeal Swab     Status: None   Collection Time: 08/30/19  3:05 PM   Specimen: Nasopharyngeal Swab  Result Value Ref Range Status   SARS Coronavirus 2 NEGATIVE NEGATIVE Final    Comment: (NOTE) SARS-CoV-2 target nucleic acids are NOT DETECTED. The SARS-CoV-2 RNA is generally detectable in upper and lower respiratory specimens during the acute phase of infection. The lowest concentration of SARS-CoV-2 viral copies this assay can detect is 250 copies / mL. A negative result does not preclude SARS-CoV-2 infection and should not be used as the sole basis for treatment or other patient management decisions.  A negative result may occur with improper specimen collection / handling, submission of specimen other than nasopharyngeal swab, presence of viral mutation(s) within the areas targeted by this assay, and inadequate number of viral copies (<250 copies / mL). A negative result must be combined with clinical observations, patient history, and epidemiological information. Fact Sheet for Patients:   StrictlyIdeas.no Fact Sheet for Healthcare Providers: BankingDealers.co.za This test is not yet approved or cleared  by the [place] FDA and has been authorized for detection and/or diagnosis of SARS-CoV-2 by FDA under an Emergency Use Authorization (EUA).  This EUA will remain in effect (meaning this test can be used) for the duration of  the COVID-19 declaration under Section 564(b)(1) of the Act, 21 U.S.C. section 360bbb-3(b)(1), unless the authorization is terminated or revoked sooner. Performed at [facility], [address] [place], [place] [address]      Labs: BNP (last 3 results) No results for input(s): BNP in the last 8760 hours. Basic Metabolic Panel: Recent Labs  Lab 08/30/19 1221 08/31/19 0158  NA 138 136  K 3.3* 4.8  CL 103 102  CO2 25 23  GLUCOSE 113* 146*  BUN 17 18  CREATININE 0.64 0.72  CALCIUM 9.5 9.5   Liver Function Tests: No results for input(s): AST, ALT, ALKPHOS, BILITOT, PROT, ALBUMIN in the last 168 hours. No results for input(s): LIPASE, AMYLASE in the last 168 hours. No results for input(s): AMMONIA in the last 168 hours. CBC: Recent Labs  Lab 08/30/19 1221 08/31/19 0158  WBC 14.1* 11.5*  NEUTROABS 11.1*  --   HGB 12.5 12.6  HCT 39.1 38.8  MCV 87.7 87.2  PLT 308 314   Cardiac Enzymes: No results for input(s): CKTOTAL, CKMB, CKMBINDEX, TROPONINI in the last 168 hours. BNP: Invalid input(s): POCBNP CBG: Recent Labs  Lab 08/30/19 1746 08/30/19 2356 08/31/19 0601 08/31/19 1157 08/31/19 1721  GLUCAP 106* 158* 135* 100* 122*   D-Dimer No results for input(s): DDIMER in the last 72 hours. Hgb A1c No results for input(s): HGBA1C in the last 72 hours. Lipid Profile No results for input(s): CHOL, HDL, LDLCALC, TRIG, CHOLHDL, LDLDIRECT in the last 72 hours. Thyroid function studies No results for input(s): TSH, T4TOTAL, T3FREE, THYROIDAB in the last 72 hours.  Invalid input(s): FREET3 Anemia work up No results for input(s): VITAMINB12, FOLATE, FERRITIN, TIBC, IRON, RETICCTPCT in the last 72 hours. Urinalysis    Component Value Date/Time   COLORURINE YELLOW 08/30/2019 1334   APPEARANCEUR CLEAR 08/30/2019 1334   LABSPEC 1.025 08/30/2019 1334   PHURINE 5.0 08/30/2019 1334   GLUCOSEU NEGATIVE 08/30/2019 1334   HGBUR SMALL (A) 08/30/2019 1334   [place] 08/30/2019 1334   KETONESUR NEGATIVE 08/30/2019 1334   PROTEINUR NEGATIVE 08/30/2019 1334   NITRITE NEGATIVE 08/30/2019 1334   LEUKOCYTESUR NEGATIVE 08/30/2019 1334   Sepsis  Labs Invalid input(s): PROCALCITONIN,  WBC,  LACTICIDVEN Microbiology Recent Results (from the past 240 hour(s))  Urine culture     Status: None   Collection Time: 08/30/19  1:33 PM   Specimen: Urine, Catheterized  Result Value Ref Range Status   Specimen Description   Final    URINE, CATHETERIZED Performed at [facility], [address], [place], [place] [address]    Special Requests   Final    NONE Performed at [facility], [address] [place], [place] [address]    Culture   Final    NO GROWTH Performed at [facility], [place] [address] [place], [place] [address]    Report Status 08/31/2019 FINAL  Final  SARS Coronavirus 2 by RT PCR (hospital order, performed in [facility] hospital lab) Nasopharyngeal Nasopharyngeal Swab     Status: None   Collection Time: 08/30/19  3:05 PM   Specimen: Nasopharyngeal Swab  Result Value Ref Range Status   SARS Coronavirus 2 NEGATIVE NEGATIVE Final    Comment: (NOTE) SARS-CoV-2 target nucleic acids are NOT DETECTED. The SARS-CoV-2 RNA is generally detectable in upper and lower respiratory specimens during the acute phase of infection. The lowest concentration of SARS-CoV-2 viral copies this assay can detect is 250 copies / mL. A negative result does not preclude  SARS-CoV-2 infection and should not be used as the sole basis for treatment or other patient management decisions.  A negative result may occur with improper specimen collection / handling, submission of specimen other than nasopharyngeal swab, presence of viral mutation(s) within the areas targeted by this assay, and inadequate number of viral copies (<250 copies / mL). A negative result must be combined with clinical observations, patient history, and epidemiological information. Fact Sheet for Patients:   StrictlyIdeas.no Fact Sheet for Healthcare Providers: BankingDealers.co.za This test is not yet approved or cleared  by  the [place] FDA and has been authorized for detection and/or diagnosis of SARS-CoV-2 by FDA under an Emergency Use Authorization (EUA).  This EUA will remain in effect (meaning this test can be used) for the duration of the COVID-19 declaration under Section 564(b)(1) of the Act, 21 U.S.C. section 360bbb-3(b)(1), unless the authorization is terminated or revoked sooner. Performed at [facility], [address] [place], [place] [address]    Time spent: 30 min  SIGNED:   Marylu Lund, MD  [facility] 09/01/2019, 6:26 PM  If 7PM-7AM, please contact night-coverage

## 2019-09-01 NOTE — Progress Notes (Signed)
   09/01/19 0732  Assess: MEWS Score  Temp 98.2 F (36.8 C)  BP 92/69  Pulse Rate 67  Resp 15  SpO2 99 %  O2 Device Room Air  Assess: MEWS Score  MEWS Temp 0  MEWS Systolic 1  MEWS Pulse 0  MEWS RR 1  MEWS LOC 0  MEWS Score 2  MEWS Score Color Yellow  Assess: if the MEWS score is Yellow or Red  Were vital signs taken at a resting state? Yes  Focused Assessment Documented focused assessment  Early Detection of Sepsis Score *See Row Information* Low  MEWS guidelines implemented *See Row Information* Yes  Treat  MEWS Interventions Other (Comment) (Not needed)  Take Vital Signs  Increase Vital Sign Frequency  Yellow: Q 2hr X 2 then Q 4hr X 2, if remains yellow, continue Q 4hrs  Escalate  MEWS: Escalate Yellow: discuss with charge nurse/RN and consider discussing with provider and RRT  Notify: Charge Nurse/RN  Name of Charge Nurse/RN Notified Emannuel Vise   Date Charge Nurse/RN Notified 09/01/19  Time Charge Nurse/RN Notified 0750  Notify: Provider  Provider Name/Title  (N/A)  Notify: Rapid Response  Name of Rapid Response RN Notified  (NA)  Document  Patient Outcome Other (Comment) (V/S rechecked, pt stable)  Pt is A&O x4, denies pain at this time, denies dizziness.

## 2019-09-01 NOTE — Plan of Care (Signed)
?  Problem: Clinical Measurements: ?Goal: Ability to maintain clinical measurements within normal limits will improve ?Outcome: Progressing ?  ?Problem: Clinical Measurements: ?Goal: Will remain free from infection ?Outcome: Progressing ?  ?Problem: Clinical Measurements: ?Goal: Respiratory complications will improve ?Outcome: Progressing ?  ?

## 2019-09-01 NOTE — TOC Transition Note (Addendum)
Transition of Care [facility]) - CM/SW Discharge Note   Patient Details  Name: ABRIYA LATTANZI MRN: [id number] Date of Birth: Nov 06, 1975  Transition of Care [facility]) CM/SW Contact:  Sharin Mons, RN Phone Number: [phone] 09/01/2019, 2:48 PM   Clinical Narrative:   Presented with  Possible Lumbar cord compression, hx of lymphedema, kidney stones and headache. From home with husband.  Pt will transition to home today with outpatient PT/OT f/u. Pt without  DME needs. Husband to assist with pt needs once d/c if needed. Pt without transportation and Rx med affordability concerns.  Mignon Hutto [facility])     [phone]       Final next level of care: Home/Self Care Barriers to Discharge: No Barriers Identified   Patient Goals and CMS Choice        Discharge Placement                       Discharge Plan and Services                                     Social Determinants of Health (SDOH) Interventions     Readmission Risk Interventions No flowsheet data found.

## 2019-09-01 NOTE — Plan of Care (Signed)
Imaging Review Note  MRI brain, C- and T-spine completed and reviewed.  MR T-spine IMPRESSION: 1. Positive for Syringohydromyelia of the thoracic spinal cord centered at the T8 level. Intermittently both above and below that level more physiologic appearing prominence of the central spinal canal is noted, but no spinal cord mass or abnormal enhancement is identified. The underlying spinal canal is capacious. 2. Otherwise normal for age MRI appearance of the thoracic spine. 3. Trace layering pleural effusions.  MR C-spine IMPRESSION: Normal for age MRI appearance of the cervical spine, and normal cervical spinal cord. No spinal stenosis or neural impingement.  MR Brain IMPRESSION: Normal MRI appearance of the brain.  RECS: Neurosurgery consult recommended.  Relayed to Dr. Wyline Copas  -- Amie [place], MD [facility] Pager: [phone] If 7pm to 7am, please call on call as listed on AMION.

## 2019-09-01 NOTE — Evaluation (Signed)
Physical Therapy Evaluation Patient Details Name: Janice Ibarra MRN: [id number] DOB: October 12, 1975 Today's Date: 09/01/2019   History of Present Illness  Pt is a 44 y.o. F with significant PMH of GERD and nephrolithiasis who presents to ED with severe back pain. Concern for possible spinal cord compression due to saddle anesthesia, urinary incontinence, constipation, rectal sphincter tone problems, back pain and leg weakness. MRI lumbar spine without definite cord compression. 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 Pt reports no further episodes of incontinence, but continued low back pain with radicular symptoms to left knee and numbness; however, she is intact to light touch upon assessment. Seems to be exacerbated by extension activities (I.e. standing, walking), however pt also reports pain in sitting position. No LLE strength deficits noted, slightly decreased hamstring flexibility (70 degrees). Ambulating 100 feet with a walker without physical assist or gait abnormalities. Recommended continued walker use for pain control and management. Will benefit from OPPT to address deficits and maximize functional independence. No further acute PT needs. PT signing off; thank you for this consult.    Follow Up Recommendations Outpatient PT (ortho)    Equipment Recommendations  None recommended by PT (borrowing walker from friend)   Recommendations for Other Services       Precautions / Restrictions Precautions Precautions: None Restrictions Weight Bearing Restrictions: No      Mobility  Bed Mobility Overal bed mobility: Modified Independent             General bed mobility comments: Cues for log roll technique  Transfers Overall transfer level: Modified independent Equipment used: Rolling walker (2  wheeled)             General transfer comment: Cues for hand placement  Ambulation/Gait Ambulation/Gait assistance: Modified independent (Device/Increase time) Gait Distance (Feet): 100 Feet Assistive device: Rolling walker (2 wheeled) Gait Pattern/deviations: WFL(Within Functional Limits)        Stairs            Wheelchair Mobility    Modified Rankin (Stroke Patients Only)       Balance Overall balance assessment: No apparent balance deficits (not formally assessed)                                           Pertinent Vitals/Pain Pain Assessment: Faces Faces Pain Scale: Hurts little more Pain Location: LBP with radicular symptoms to left knee, exacerbated with extensor activities Pain Descriptors / Indicators: Numbness;Grimacing Pain Intervention(s): Monitored during session    Home Living Family/patient expects to be discharged to:: Private residence Living Arrangements: Spouse/significant other;Children(3 teenagers) Available Help at Discharge: Family Type of Home: House Home Access: Stairs to enter   Technical brewer of Steps: 1 Home Layout: Able to live on main level with bedroom/bathroom Home Equipment: Shower seat      Prior Function Level of Independence: Independent         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      Hand Dominance        Extremity/Trunk Assessment   Upper Extremity Assessment Upper Extremity Assessment: RUE deficits/detail;LUE deficits/detail RUE Deficits / Details: Strength 5/5 LUE Deficits / Details: Strength 5/5    Lower Extremity Assessment Lower  Extremity Assessment: RLE deficits/detail;LLE deficits/detail RLE Deficits / Details: Strength 5/5 LLE Deficits / Details: Strength 5/5    Cervical / Trunk Assessment Cervical / Trunk Assessment: Normal  Communication   Communication: No difficulties  Cognition Arousal/Alertness: Awake/alert Behavior During Therapy: WFL for tasks  assessed/performed Overall Cognitive Status: Within Functional Limits for tasks assessed                                        General Comments      Exercises     Assessment/Plan    PT Assessment Patent does not need any further PT services  PT Problem List Pain       PT Treatment Interventions      PT Goals (Current goals can be found in the Care Plan section)  Acute Rehab PT Goals Patient Stated Goal: "return to work." PT Goal Formulation: All assessment and education complete, DC therapy    Frequency     Barriers to discharge        Co-evaluation               AM-PAC PT "6 Clicks" Mobility  Outcome Measure Help needed turning from your back to your side while in a flat bed without using bedrails?: None Help needed moving from lying on your back to sitting on the side of a flat bed without using bedrails?: None Help needed moving to and from a bed to a chair (including a wheelchair)?: None Help needed standing up from a chair using your arms (e.g., wheelchair or bedside chair)?: None Help needed to walk in hospital room?: None Help needed climbing 3-5 steps with a railing? : A Little 6 Click Score: 23    End of Session   Activity Tolerance: Patient tolerated treatment well Patient left: in bed;with call bell/phone within reach;with family/visitor present Nurse Communication: Mobility status PT Visit Diagnosis: Pain Pain - part of body: (back)    Time: 1411-1435 PT Time Calculation (min) (ACUTE ONLY): 24 min   Charges:   PT Evaluation $PT Eval Moderate Complexity: 1 Mod PT Treatments $Therapeutic Activity: 8-22 mins          Janice Ibarra, PT, DPT Acute Rehabilitation Services Pager [phone] Office [phone]   Janice Ibarra 09/01/2019, 2:45 PM

## 2019-09-01 NOTE — Progress Notes (Addendum)
Patients alert and oriented. Ambulating with walker independently. Voided without problem after foley discontinue. Discharge instruction given. Out patient PT/OT with [facility]. Patient discharge home with husband.

## 2019-09-05 ENCOUNTER — Ambulatory Visit (HOSPITAL_COMMUNITY): Payer: BC Managed Care – PPO | Attending: Internal Medicine | Admitting: Physical Therapy

## 2019-09-05 ENCOUNTER — Other Ambulatory Visit: Payer: Self-pay

## 2019-09-05 DIAGNOSIS — M545 Low back pain, unspecified: Secondary | ICD-10-CM

## 2019-09-05 DIAGNOSIS — M79605 Pain in left leg: Secondary | ICD-10-CM | POA: Diagnosis not present

## 2019-09-05 DIAGNOSIS — M6281 Muscle weakness (generalized): Secondary | ICD-10-CM | POA: Diagnosis not present

## 2019-09-06 NOTE — Therapy (Signed)
[place], [place], [address] Phone: [phone]   Fax:  [phone]  Physical Therapy Evaluation  Patient Details  Name: MARKALA NESTOR MRN: [id number] Date of Birth: 44-May-1977 Referring Provider (PT): Donne Hazel, MD   Encounter Date: 09/05/2019  PT End of Session - 09/05/19 0821    Visit Number  1    Number of Visits  12    Date for PT Re-Evaluation  10/17/19    Authorization Type  [employer] PPO (30 combined visit limit PT/OT)    Authorization - Visit Number  1    Authorization - Number of Visits  30    Progress Note Due on Visit  10    PT Start Time  1520    PT Stop Time  1600    PT Time Calculation (min)  40 min    Activity Tolerance  Patient tolerated treatment well;Patient limited by pain    Behavior During Therapy  [facility] for tasks assessed/performed       Past Medical History:  Diagnosis Date  . Headache   . History of kidney stones   . Lymphedema   . PONV (postoperative nausea and vomiting)     Past Surgical History:  Procedure Laterality Date  . ABDOMINAL HYSTERECTOMY    . CHOLECYSTECTOMY    . COLONOSCOPY N/A 02/19/2017   Procedure: COLONOSCOPY;  Surgeon: Rogene Houston, MD;  [facility];  Service: Endoscopy;  Laterality: N/A;  7:30  . ESCHAROTOMY    . ESOPHAGEAL DILATION N/A 02/19/2017   Procedure: ESOPHAGEAL DILATION;  Surgeon: Rogene Houston, MD;  [facility];  Service: Endoscopy;  Laterality: N/A;  . ESOPHAGOGASTRODUODENOSCOPY N/A 02/19/2017   Procedure: ESOPHAGOGASTRODUODENOSCOPY (EGD);  Surgeon: Rogene Houston, MD;  [facility];  Service: Endoscopy;  Laterality: N/A;  . SKIN GRAFT Right    leg and arm    There were no vitals filed for this visit.   Subjective Assessment - 09/05/19 1525    Subjective  Patient reported that on 08/28/19 she had an aching pain in her back. She stated that her husband startled her and she jumped when she landed  on her left leg she had a searing pain and could not put any weight through her leg. She stated now she is having pain through her anterior hip and that she has pain radiating down the back of her leg. She stated it feels better now when she stands up. She stated she went to the ED and they thought she might have cord compression, but imaging for this was negative. She stated that she has not been able to sleep through the night without pain medication. Patient reported that she has trouble telling if she is going to the bathroom or not due to decreased sensation in her groin area.    Pertinent History  Onset of acute LBP and left leg pain on 08/28/19    Limitations  Standing;Walking;Sitting    How long can you sit comfortably?  10-15 minutes    How long can you stand comfortably?  10-15 minutes    How long can you walk comfortably?  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  Yes    Pain Score  5     Pain Location  Groin    Pain Orientation  Left    Pain Descriptors / Indicators  Aching    Pain Type  Acute pain    Pain Onset  1 to 4 weeks ago    Pain Frequency  Constant    Aggravating Factors   Laying down    Pain Relieving Factors  Standing               09/05/19 0001  Assessment  Medical Diagnosis Acute Left-sided LBP with Left-sided sciatica  Referring Provider (PT) Donne Hazel, MD  Onset Date/Surgical Date 08/28/19  Next MD Visit  (Unknown)  Prior Therapy Yes, for lymphedema  Precautions  Precautions None  Restrictions  Weight Bearing Restrictions No  Balance Screen  Has the patient fallen in the past 6 months No  Prior Function  Level of Independence Independent  Vocation Full time employment  Vocation Requirements Nurse at [employer]  Overall Cognitive Status Within  Functional Limits for tasks assessed  Observation/Other Assessments  Observations When patient sitting shifting weight off of the LT side  Focus on Therapeutic Outcomes (FOTO)  Perform next session  Sensation  Light Touch Impaired by gross assessment  Additional Comments LLE decreased sensation upper thigh/groin area  ROM / Strength  AROM / PROM / Strength AROM;Strength  AROM  AROM Assessment Site Lumbar  Lumbar Flexion 50% limited  Lumbar Extension 75% limited  Lumbar - Right Side Bend 50% limited; painful on left side  Lumbar - Left Side Bend 25% limited no pain  Lumbar - Right Rotation WFL  Lumbar - Left Rotation [facility]  Strength  Strength Assessment Site Hip;Knee;Ankle  Right/Left Hip Right;Left  Right/Left Knee Right;Left  Right/Left Ankle Right;Left  Right Hip Flexion 5/5  Right Hip Extension 4+/5  Right Hip ABduction 5/5 (Painful to lay on LT side)  Left Hip Flexion 4-/5  Left Hip Extension 4-/5 (Pain in groin)  Left Hip ABduction 5/5 (felt good)  Right Knee Flexion 5/5  Right Knee Extension 5/5  Left Knee Flexion 4+/5  Left Knee Extension 4/5 (Painful)  Right Ankle Dorsiflexion 5/5  Left Ankle Dorsiflexion 5/5 (painful)  Palpation  Spinal mobility CPAs lumbar spine tenderness muscle guarding difficult to assess mobility. Increase in symptoms in groin with CPAs.   Palpation comment Tender to palpation at hip flexor insertion on LT. Relief of pain with pressing on ischial tuberosity on LT side.             Objective measurements completed on examination: See above findings.              PT Education - 09/05/19 0820    Education Details  Discussed examination findings, POC, and initial HEP.    Person(s) Educated  Patient    Methods  Explanation    Comprehension  Verbalized understanding;Returned demonstration       PT Short Term Goals - 09/05/19 0827      PT SHORT TERM GOAL #1   Title  Patient will report understanding and regular  compliance with HEP to improve strength, mobility, and decrease pain.    Time  3    Period  Weeks    Status  New    Target Date  09/26/19      PT SHORT TERM GOAL #2   Title  Patient will report an overall improvement of at least 25% in subjective complaint to improve QoL.    Time  3  Period  Weeks    Status  New    Target Date  09/26/19        PT Long Term Goals - 09/06/19 [id number]      PT LONG TERM GOAL #1   Title  Patient will report an overall improvement of at least 50% in subjective complaint to improve QoL.    Time  6    Period  Weeks    Status  New    Target Date  10/17/19      PT LONG TERM GOAL #2   Title  Patient will demonstrate ability to sit with equal weight distribution on each side with minimal to no increase in pain in order to eat meals with family with improved ease.    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    Time  6    Period  Weeks    Status  New    Target Date  10/17/19             Plan - 09/05/19 0843    Clinical Impression Statement  Patient is a 44 year old female who presents to outpatient physical therapy with primary complaint of acute weakness, paresthesias, and pain in her left leg and on the left side of her groin which are currently limiting her functional mobility. Patient demonstrates decreased lumbar AROM and increased pain. 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 The patient demonstrates decreased left lower  extremity strength as well as decreased sensation in the superior aspect of the left lower extremity. Patient reported increased pain with CPAs of lumbar spine and reported tenderness to palpation through left hip flexors and adductor region. Patient did report an improvement in her pain symptoms with MMT of left hip abduction. Educated patient on isometric hip abduction using a belt as initial HEP. Patient would benefit from continued skilled physical therapy in order to address the abovementioned deficits as well as to continue assessment as needed to address the abovementioned deficits and help patient return to her PLOF.    Personal Factors and Comorbidities  Comorbidity 1    Comorbidities  see above    Examination-Activity Limitations  Stand;Bathing;Bed Mobility;Locomotion Level;Toileting;Bend;Transfers;Sit;Sleep;Squat;Stairs    Examination-Participation Restrictions  Yard Work;Meal Prep;Community Activity;Laundry    Stability/Clinical Decision Making  Evolving/Moderate complexity    Clinical Decision Making  Moderate    Rehab Potential  Good    PT Frequency  2x / week    PT Duration  6 weeks    PT Treatment/Interventions  ADLs/Self Care Home Management;Aquatic Therapy;Cryotherapy;Electrical Stimulation;Moist Heat;Traction;DME Instruction;Gait training;Stair training;Functional mobility training;Therapeutic activities;Therapeutic exercise;Balance training;Neuromuscular re-education;Patient/family education;Orthotic Fit/Training;Manual techniques;Passive range of motion;Dry needling;Taping    PT Next Visit Plan  Review HEP, perform FOTO, assess hip ROM. Initiate core strengthening, trial hip isometrics. Consider soft tissue mobilization for lumbar paraspinals, hip adductors and hip flexors for pain relief.    PT  Home Exercise Plan  09/05/19: hip abduction isometrics 10x10'' with belt    Consulted and Agree with Plan of Care  Patient       Patient will benefit from skilled therapeutic intervention  in order to improve the following deficits and impairments:  Impaired sensation, Pain, Decreased mobility, Postural dysfunction, Decreased activity tolerance, Decreased endurance, Decreased range of motion, Decreased strength, Hypomobility, Difficulty walking  Visit Diagnosis: Acute left-sided low back pain, unspecified whether sciatica present  Pain in left leg  Muscle weakness (generalized)     Problem List Patient Active Problem List   Diagnosis Date Noted  . Syringohydromyelia ([place]) 09/01/2019  . Possible Lumbar cord compression ([place]) 08/30/2019  . Low back pain-Possible Spinal Cord Compression--saddle anesthesia, urinary incontinence, constipation, rectal sphincter tone problems, back pain and leg weakness 08/30/2019  . GERD (gastroesophageal reflux disease) 08/30/2019  . Esophageal dysphagia 01/19/2017  . Family hx of colon cancer 01/19/2017   Clarene Critchley PT, DPT 8:51 AM, 09/06/19 [place] [address] [place], [place], [address] Phone: [phone]   Fax:  [phone]  Name: MARQUIE EXCELL MRN: [id number] Date of Birth: [birth year]

## 2019-09-07 ENCOUNTER — Ambulatory Visit (HOSPITAL_COMMUNITY): Payer: BC Managed Care – PPO | Admitting: Physical Therapy

## 2019-09-07 ENCOUNTER — Telehealth (HOSPITAL_COMMUNITY): Payer: Self-pay | Admitting: Physical Therapy

## 2019-09-07 ENCOUNTER — Other Ambulatory Visit: Payer: Self-pay

## 2019-09-07 DIAGNOSIS — M79605 Pain in left leg: Secondary | ICD-10-CM

## 2019-09-07 DIAGNOSIS — M545 Low back pain, unspecified: Secondary | ICD-10-CM

## 2019-09-07 DIAGNOSIS — M6281 Muscle weakness (generalized): Secondary | ICD-10-CM

## 2019-09-07 NOTE — Telephone Encounter (Signed)
pt was late for her prev appt r/s appt to see another therapist for the same day

## 2019-09-07 NOTE — Therapy (Signed)
[place], [place], [address] Phone: [phone]   Fax:  [phone]  Physical Therapy Treatment  Patient Details  Name: Janice Ibarra MRN: [id number] Date of Birth: 12-Apr-1976 Referring Provider (PT): Donne Hazel, MD   Encounter Date: 09/07/2019  PT End of Session - 09/07/19 0951    Visit Number  2    Number of Visits  12    Date for PT Re-Evaluation  10/17/19    Authorization Type  [employer] PPO (30 combined visit limit PT/OT)    Authorization - Visit Number  2    Authorization - Number of Visits  30    Progress Note Due on Visit  10    PT Start Time  0926    PT Stop Time  0958    PT Time Calculation (min)  32 min    Activity Tolerance  Patient tolerated treatment well;Patient limited by pain    Behavior During Therapy  [facility] for tasks assessed/performed       Past Medical History:  Diagnosis Date  . Headache   . History of kidney stones   . Lymphedema   . PONV (postoperative nausea and vomiting)     Past Surgical History:  Procedure Laterality Date  . ABDOMINAL HYSTERECTOMY    . CHOLECYSTECTOMY    . COLONOSCOPY N/A 02/19/2017   Procedure: COLONOSCOPY;  Surgeon: Rogene Houston, MD;  [facility];  Service: Endoscopy;  Laterality: N/A;  7:30  . ESCHAROTOMY    . ESOPHAGEAL DILATION N/A 02/19/2017   Procedure: ESOPHAGEAL DILATION;  Surgeon: Rogene Houston, MD;  [facility];  Service: Endoscopy;  Laterality: N/A;  . ESOPHAGOGASTRODUODENOSCOPY N/A 02/19/2017   Procedure: ESOPHAGOGASTRODUODENOSCOPY (EGD);  Surgeon: Rogene Houston, MD;  [facility];  Service: Endoscopy;  Laterality: N/A;  . SKIN GRAFT Right    leg and arm    There were no vitals filed for this visit.  Subjective Assessment - 09/07/19 0934    Subjective  PT states that she feels better since the exercises that she was given and has noticec a decrease in numbness    Pertinent History  Onset of  acute LBP and left leg pain on 08/28/19    Limitations  Standing;Walking;Sitting    How long can you sit comfortably?  10-15 minutes    How long can you stand comfortably?  10-15 minutes    How long can you walk comfortably?  10-15 minutes    Patient Stated Goals  Pain relief    Currently in Pain?  Yes    Pain Score  7     Pain Location  Back    Pain Orientation  Left    Pain Descriptors / Indicators  Aching    Pain Type  Acute pain    Pain Onset  1 to 4 weeks ago    Pain Frequency  Constant    Aggravating Factors   laying down    Pain Relieving Factors  standing         [facility] PT Assessment - 09/07/19 0001      Observation/Other Assessments   Focus on Therapeutic Outcomes (FOTO)   50      Posture/Postural Control   Posture/Postural Control  Postural limitations    Posture Comments  Lt PSIS, Iliac crest lower; ASIS higher  [place] Adult PT Treatment/Exercise - 09/07/19 0001      Exercises   Exercises  Lumbar      Lumbar Exercises: Supine   Ab Set  10 reps    Bridge  10 reps    Other Supine Lumbar Exercises  isometric hip ab/add prior to muscle energy techniques       Manual Therapy   Manual Therapy  Muscle Energy Technique    Manual therapy comments  completed seperate from all other aspects of treatmdnt     Muscle Energy Technique  to correct posterior rotatiion of LT SI                PT Short Term Goals - 09/05/19 0827      PT SHORT TERM GOAL #1   Title  Patient will report understanding and regular compliance with HEP to improve strength, mobility, and decrease pain.    Time  3    Period  Weeks    Status  New    Target Date  09/26/19      PT SHORT TERM GOAL #2   Title  Patient will report an overall improvement of at least 25% in subjective complaint to improve QoL.    Time  3    Period  Weeks    Status  New    Target Date  09/26/19        PT Long Term Goals - 09/06/19 [id number]      PT LONG TERM GOAL #1   Title   Patient will report an overall improvement of at least 50% in subjective complaint to improve QoL.    Time  6    Period  Weeks    Status  New    Target Date  10/17/19      PT LONG TERM GOAL #2   Title  Patient will demonstrate ability to sit with equal weight distribution on each side with minimal to no increase in pain in order to eat meals with family with improved ease.    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    Time  6    Period  Weeks    Status  New    Target Date  10/17/19            Plan - 09/07/19 [id number]    Clinical Impression Statement  Pt late this session, completed foto, evaluated SI as therapist noted that Lt iliac crest was lower than RT with positive SI dysfunction noted.  Pt responded well to muscle energy .  Added additional HEP    Personal Factors and Comorbidities  Comorbidity 1    Comorbidities  see above    Examination-Activity Limitations  Stand;Bathing;Bed Mobility;Locomotion Level;Toileting;Bend;Transfers;Sit;Sleep;Squat;Stairs    Examination-Participation Restrictions  Yard Work;Meal Prep;Community Activity;Laundry    Stability/Clinical Decision Making  Evolving/Moderate complexity    Rehab Potential  Good    PT Frequency  2x / week    PT Duration  6 weeks    PT Treatment/Interventions  ADLs/Self Care Home Management;Aquatic Therapy;Cryotherapy;Electrical Stimulation;Moist Heat;Traction;DME Instruction;Gait training;Stair training;Functional mobility training;Therapeutic activities;Therapeutic exercise;Balance training;Neuromuscular re-education;Patient/family education;Orthotic Fit/Training;Manual techniques;Passive range of motion;Dry needling;Taping    PT  Next Visit Plan  Assess SI , assess hip ROM. Initiate core strengthening,  trial hip isometrics. Consider soft tissue mobilization for lumbar paraspinals, hip adductors and hip flexors for pain relief.    PT Home Exercise Plan  09/05/19: hip abduction isometrics 10x10'' with belt; back extension, abset, Lt leg over bed x 1 minute for SI    Consulted and Agree with Plan of Care  Patient       Patient will benefit from skilled therapeutic intervention in order to improve the following deficits and impairments:  Impaired sensation, Pain, Decreased mobility, Postural dysfunction, Decreased activity tolerance, Decreased endurance, Decreased range of motion, Decreased strength, Hypomobility, Difficulty walking  Visit Diagnosis: Acute left-sided low back pain, unspecified whether sciatica present  Pain in left leg  Muscle weakness (generalized)     Problem List Patient Active Problem List   Diagnosis Date Noted  . Syringohydromyelia ([place]) 09/01/2019  . Possible Lumbar cord compression ([place]) 08/30/2019  . Low back pain-Possible Spinal Cord Compression--saddle anesthesia, urinary incontinence, constipation, rectal sphincter tone problems, back pain and leg weakness 08/30/2019  . GERD (gastroesophageal reflux disease) 08/30/2019  . Esophageal dysphagia 01/19/2017  . Family hx of colon cancer 01/19/2017    Rayetta Humphrey, PT CLT [phone] 09/07/2019, 10:00 AM  [place], [place], [address] Phone: [phone]   Fax:  [phone]  Name: MEDIE SCHLENKER MRN: [id number] Date of Birth: July 29, 1975

## 2019-09-12 ENCOUNTER — Ambulatory Visit (HOSPITAL_COMMUNITY): Payer: BC Managed Care – PPO | Attending: Internal Medicine

## 2019-09-12 ENCOUNTER — Other Ambulatory Visit: Payer: Self-pay

## 2019-09-12 ENCOUNTER — Encounter (HOSPITAL_COMMUNITY): Payer: Self-pay

## 2019-09-12 DIAGNOSIS — M6281 Muscle weakness (generalized): Secondary | ICD-10-CM

## 2019-09-12 DIAGNOSIS — M545 Low back pain, unspecified: Secondary | ICD-10-CM

## 2019-09-12 DIAGNOSIS — M79605 Pain in left leg: Secondary | ICD-10-CM | POA: Diagnosis not present

## 2019-09-12 NOTE — Therapy (Signed)
[place], [place], [address] Phone: [phone]   Fax:  [phone]  Physical Therapy Treatment  Patient Details  Name: Janice Ibarra MRN: [id number] Date of Birth: 1975-08-28 Referring Provider (PT): Donne Hazel, MD   Encounter Date: 09/12/2019  PT End of Session - 09/12/19 1818    Visit Number  3    Number of Visits  12    Date for PT Re-Evaluation  10/17/19    Authorization Type  [employer] PPO (30 combined visit limit PT/OT)    Authorization - Visit Number  3    Authorization - Number of Visits  30    Progress Note Due on Visit  10    PT Start Time  1755   Therapist late   PT Stop Time  1834    PT Time Calculation (min)  39 min    Activity Tolerance  Patient tolerated treatment well;Patient limited by pain    Behavior During Therapy  [facility] for tasks assessed/performed       Past Medical History:  Diagnosis Date   Headache    History of kidney stones    Lymphedema    PONV (postoperative nausea and vomiting)     Past Surgical History:  Procedure Laterality Date   ABDOMINAL HYSTERECTOMY     CHOLECYSTECTOMY     COLONOSCOPY N/A 02/19/2017   Procedure: COLONOSCOPY;  Surgeon: Rogene Houston, MD;  [facility];  Service: Endoscopy;  Laterality: N/A;  7:30   ESCHAROTOMY     ESOPHAGEAL DILATION N/A 02/19/2017   Procedure: ESOPHAGEAL DILATION;  Surgeon: Rogene Houston, MD;  [facility];  Service: Endoscopy;  Laterality: N/A;   ESOPHAGOGASTRODUODENOSCOPY N/A 02/19/2017   Procedure: ESOPHAGOGASTRODUODENOSCOPY (EGD);  Surgeon: Rogene Houston, MD;  [facility];  Service: Endoscopy;  Laterality: N/A;   SKIN GRAFT Right    leg and arm    There were no vitals filed for this visit.  Subjective Assessment - 09/12/19 1800    Subjective  Pt reports relief for a couple days following last session.  Current pain scale 6/10 Lt LBP, gluteal and radicular symtoms  anterior and lateral aspect ending at knee.  Reports compliance with HEP daily.    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  Yes    Pain Score  6     Pain Location  Back    Pain Orientation  Left    Pain Descriptors / Indicators  Aching   sharp pain occasionaly with position change   Pain Type  Acute pain    Pain Onset  1 to 4 weeks ago    Pain Frequency  Constant    Aggravating Factors   laying down    Pain Relieving Factors  standing                        [facility] Adult PT Treatment/Exercise - 09/12/19 0001      Posture/Postural Control   Posture/Postural Control  Postural limitations      Exercises   Exercises  Lumbar      Lumbar Exercises: Supine   Ab Set  10 reps    AB Set Limitations  5: holds    Clam  5 reps;3 seconds    Clam Limitations  cueing for stability    Bridge  10 reps;3 seconds    Isometric Hip Flexion  10 reps;3 seconds   reports increased symptoms   Other Supine Lumbar Exercises  isometric hip ab/add prior to muscle energy techniques       Manual Therapy   Manual Therapy  Muscle Energy Technique    Manual therapy comments  completed seperate from all other aspects of treatmdnt     Muscle Energy Technique  to correct posterior rotatiion of LT SI                PT Short Term Goals - 09/05/19 0827      PT SHORT TERM GOAL #1   Title  Patient will report understanding and regular compliance with HEP to improve strength, mobility, and decrease pain.    Time  3    Period  Weeks    Status  New    Target Date  09/26/19      PT SHORT TERM GOAL #2   Title  Patient will report an overall improvement of at least 25% in subjective complaint to improve QoL.    Time  3    Period  Weeks    Status   New    Target Date  09/26/19        PT Long Term Goals - 09/06/19 1275      PT LONG TERM GOAL #1   Title  Patient will report an overall improvement of at least 50% in subjective complaint to improve QoL.    Time  6    Period  Weeks    Status  New    Target Date  10/17/19      PT LONG TERM GOAL #2   Title  Patient will demonstrate ability to sit with equal weight distribution on each side with minimal to no increase in pain in order to eat meals with family with improved ease.    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    Time  6    Period  Weeks    Status  New    Target Date  10/17/19            Plan - 09/12/19 1814    Clinical Impression Statement  Assessed SI alignement with MET complete for Lt SI rotatoin with reports of relief following muscle energy technique.  Pt continues to have LLD, encouraged to add 1/4in heel support Lt LE.  Continued core strengthening wiht min cueing for stability with task.  Reports pain reduced to 3/10 at EOS, was 6/10.    Personal Factors and Comorbidities  Comorbidity 1    Comorbidities  see above    Examination-Activity Limitations  Stand;Bathing;Bed Mobility;Locomotion Level;Toileting;Bend;Transfers;Sit;Sleep;Squat;Stairs    Examination-Participation Restrictions  Yard Work;Meal Prep;Community Activity;Laundry    Stability/Clinical Decision Making  Evolving/Moderate complexity    Clinical Decision Making  Moderate    Rehab Potential  Good    PT Duration  6 weeks    PT Treatment/Interventions  ADLs/Self  Care Home Management;Aquatic Therapy;Cryotherapy;Electrical Stimulation;Moist Heat;Traction;DME Instruction;Gait training;Stair training;Functional mobility training;Therapeutic activities;Therapeutic  exercise;Balance training;Neuromuscular re-education;Patient/family education;Orthotic Fit/Training;Manual techniques;Passive range of motion;Dry needling;Taping    PT Next Visit Plan  Assess SI , assess hip ROM. Initiate core strengthening, trial hip isometrics. Consider soft tissue mobilization for lumbar paraspinals, hip adductors and hip flexors for pain relief.    PT Home Exercise Plan  09/05/19: hip abduction isometrics 10x10'' with belt; back extension, abset, Lt leg over bed x 1 minute for SI       Patient will benefit from skilled therapeutic intervention in order to improve the following deficits and impairments:  Impaired sensation, Pain, Decreased mobility, Postural dysfunction, Decreased activity tolerance, Decreased endurance, Decreased range of motion, Decreased strength, Hypomobility, Difficulty walking  Visit Diagnosis: Pain in left leg  Muscle weakness (generalized)  Acute left-sided low back pain, unspecified whether sciatica present     Problem List Patient Active Problem List   Diagnosis Date Noted   Syringohydromyelia ([place]) 09/01/2019   Possible Lumbar cord compression ([place]) 08/30/2019   Low back pain-Possible Spinal Cord Compression--saddle anesthesia, urinary incontinence, constipation, rectal sphincter tone problems, back pain and leg weakness 08/30/2019   GERD (gastroesophageal reflux disease) 08/30/2019   Esophageal dysphagia 01/19/2017   Family hx of colon cancer 01/19/2017   Ihor Austin, LPTA/CLT; CBIS [phone]  Aldona Lento 09/12/2019, 6:39 PM  [place], [place], [address] Phone: [phone]   Fax:  [phone]  Name: Janice Ibarra MRN: [id number] Date of Birth: Feb 12, 1976

## 2019-09-13 ENCOUNTER — Ambulatory Visit (HOSPITAL_COMMUNITY): Payer: BC Managed Care – PPO | Admitting: Physical Therapy

## 2019-09-13 DIAGNOSIS — M545 Low back pain, unspecified: Secondary | ICD-10-CM

## 2019-09-13 DIAGNOSIS — M79605 Pain in left leg: Secondary | ICD-10-CM | POA: Diagnosis not present

## 2019-09-13 DIAGNOSIS — M6281 Muscle weakness (generalized): Secondary | ICD-10-CM

## 2019-09-13 NOTE — Therapy (Signed)
[place], [place], [address] Phone: [phone]   Fax:  [phone]  Physical Therapy Treatment  Patient Details  Name: Janice Ibarra MRN: [id number] Date of Birth: December 15, 1975 Referring Provider (PT): Donne Hazel, MD   Encounter Date: 09/13/2019  PT End of Session - 09/13/19 1401    Visit Number  4    Number of Visits  12    Date for PT Re-Evaluation  10/17/19    Authorization Type  [employer] PPO (30 combined visit limit PT/OT)    Authorization - Visit Number  4    Authorization - Number of Visits  30    Progress Note Due on Visit  10    PT Start Time  1316    PT Stop Time  1356    PT Time Calculation (min)  40 min    Activity Tolerance  Patient tolerated treatment well;Patient limited by pain    Behavior During Therapy  [facility] for tasks assessed/performed       Past Medical History:  Diagnosis Date  . Headache   . History of kidney stones   . Lymphedema   . PONV (postoperative nausea and vomiting)     Past Surgical History:  Procedure Laterality Date  . ABDOMINAL HYSTERECTOMY    . CHOLECYSTECTOMY    . COLONOSCOPY N/A 02/19/2017   Procedure: COLONOSCOPY;  Surgeon: Rogene Houston, MD;  [facility];  Service: Endoscopy;  Laterality: N/A;  7:30  . ESCHAROTOMY    . ESOPHAGEAL DILATION N/A 02/19/2017   Procedure: ESOPHAGEAL DILATION;  Surgeon: Rogene Houston, MD;  [facility];  Service: Endoscopy;  Laterality: N/A;  . ESOPHAGOGASTRODUODENOSCOPY N/A 02/19/2017   Procedure: ESOPHAGOGASTRODUODENOSCOPY (EGD);  Surgeon: Rogene Houston, MD;  [facility];  Service: Endoscopy;  Laterality: N/A;  . SKIN GRAFT Right    leg and arm    There were no vitals filed for this visit.      [facility] PT Assessment - 09/13/19 0001      Assessment   Medical Diagnosis  Acute Left-sided LBP with Left-sided sciatica    Referring Provider (PT)  Donne Hazel, MD    Onset  Date/Surgical Date  08/28/19                    [facility] Adult PT Treatment/Exercise - 09/13/19 0001      Lumbar Exercises: Stretches   Lower Trunk Rotation  10 seconds   B x20 reps     Lumbar Exercises: Seated   Other Seated Lumbar Exercises  lumbar roll in seated position      Lumbar Exercises: Supine   Other Supine Lumbar Exercises  SIJ correction isometric - push pull/hip abd/add 2x5 5" holds Each - hook lying    Other Supine Lumbar Exercises  lumbar roll in supine position - 2 minutes      Manual Therapy   Manual Therapy  Joint mobilization;Soft tissue mobilization;Manual Traction    Manual therapy comments  completed seperate from all other aspects of treatmdnt     Joint Mobilization  UPA to left ribs 6-12 and CPA to T10-L5 grade II/II - felt good     Soft tissue mobilization  STM to left QL, lumbar/thoracic paraspinals and glue med and TFL L     Manual Traction  long axis traction of left hip - felt good - less pain afterwards  PT Education - 09/13/19 1359    Education Details  reviewed HEP and printed off exercises per patient request (all previous exercises) educated patient in typical healing process and nerve symptoms.    Person(s) Educated  Patient    Methods  Explanation    Comprehension  Verbalized understanding       PT Short Term Goals - 09/05/19 0827      PT SHORT TERM GOAL #1   Title  Patient will report understanding and regular compliance with HEP to improve strength, mobility, and decrease pain.    Time  3    Period  Weeks    Status  New    Target Date  09/26/19      PT SHORT TERM GOAL #2   Title  Patient will report an overall improvement of at least 25% in subjective complaint to improve QoL.    Time  3    Period  Weeks    Status  New    Target Date  09/26/19        PT Long Term Goals - 09/06/19 [id number]      PT LONG TERM GOAL #1   Title  Patient will report an overall improvement of at least 50% in subjective  complaint to improve QoL.    Time  6    Period  Weeks    Status  New    Target Date  10/17/19      PT LONG TERM GOAL #2   Title  Patient will demonstrate ability to sit with equal weight distribution on each side with minimal to no increase in pain in order to eat meals with family with improved ease.    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    Time  6    Period  Weeks    Status  New    Target Date  10/17/19            Plan - 09/13/19 1401    Clinical Impression Statement  Focused on joint mobilization and this was tolerated very well. answered all question about current condition and continued numbness. Printed off all previous exercises for HEP per patient request to improve adherence to HEP. Will continue to focus on joint and soft tissue mobility to improve radicular symptoms.    Personal Factors and Comorbidities  Comorbidity 1    Comorbidities  see above    Examination-Activity Limitations  Stand;Bathing;Bed Mobility;Locomotion Level;Toileting;Bend;Transfers;Sit;Sleep;Squat;Stairs    Examination-Participation Restrictions  Yard Work;Meal Prep;Community Activity;Laundry    Stability/Clinical Decision Making  Evolving/Moderate complexity    Rehab Potential  Good    PT Duration  6 weeks    PT Treatment/Interventions  ADLs/Self Care Home Management;Aquatic Therapy;Cryotherapy;Electrical Stimulation;Moist Heat;Traction;DME Instruction;Gait training;Stair training;Functional mobility training;Therapeutic activities;Therapeutic exercise;Balance training;Neuromuscular re-education;Patient/family education;Orthotic Fit/Training;Manual techniques;Passive range of motion;Dry needling;Taping    PT Next Visit Plan  Assess SI , assess hip ROM. Initiate core  strengthening, trial hip isometrics. Consider soft tissue mobilization for lumbar paraspinals, hip adductors and hip flexors for pain relief.    PT Home Exercise Plan  09/05/19: hip abduction isometrics 10x10'' with belt; back extension, abset, Lt leg over bed x 1  minute for SI; 6/2 lumbar roll, lumbar rotaiton       Patient will benefit from skilled therapeutic intervention in order to improve the following deficits and impairments:  Impaired sensation, Pain, Decreased mobility, Postural dysfunction, Decreased activity tolerance, Decreased endurance, Decreased range of motion, Decreased strength, Hypomobility, Difficulty walking  Visit Diagnosis: Pain in left leg  Muscle weakness (generalized)  Acute left-sided low back pain, unspecified whether sciatica present     Problem List Patient Active Problem List   Diagnosis Date Noted  . Syringohydromyelia ([place]) 09/01/2019  . Possible Lumbar cord compression ([place]) 08/30/2019  . Low back pain-Possible Spinal Cord Compression--saddle anesthesia, urinary incontinence, constipation, rectal sphincter tone problems, back pain and leg weakness 08/30/2019  . GERD (gastroesophageal reflux disease) 08/30/2019  . Esophageal dysphagia 01/19/2017  . Family hx of colon cancer 01/19/2017   2:03 PM, 09/13/19 Jerene Pitch, DPT Physical Therapy with [facility]  [phone] office  [place] [address] [place], [place], [address] Phone: [phone]   Fax:  [phone]  Name: Janice Ibarra MRN: [id number] Date of Birth: 12-01-1975

## 2019-09-21 ENCOUNTER — Other Ambulatory Visit: Payer: Self-pay

## 2019-09-21 ENCOUNTER — Ambulatory Visit (HOSPITAL_COMMUNITY): Payer: BC Managed Care – PPO | Admitting: Physical Therapy

## 2019-09-21 ENCOUNTER — Encounter (HOSPITAL_COMMUNITY): Payer: Self-pay | Admitting: Physical Therapy

## 2019-09-21 DIAGNOSIS — M79605 Pain in left leg: Secondary | ICD-10-CM

## 2019-09-21 DIAGNOSIS — M6281 Muscle weakness (generalized): Secondary | ICD-10-CM | POA: Diagnosis not present

## 2019-09-21 DIAGNOSIS — M545 Low back pain, unspecified: Secondary | ICD-10-CM

## 2019-09-21 NOTE — Therapy (Signed)
[place], [place], [address] Phone: [phone]   Fax:  [phone]  Physical Therapy Treatment  Patient Details  Name: Janice Ibarra MRN: [id number] Date of Birth: 05-16-1975 Referring Provider (PT): Donne Hazel, MD   Encounter Date: 09/21/2019   PT End of Session - 09/21/19 0950    Visit Number 5    Number of Visits 12    Date for PT Re-Evaluation 10/17/19    Authorization Type [employer] PPO (30 combined visit limit PT/OT)    Authorization - Visit Number 5    Authorization - Number of Visits 30    Progress Note Due on Visit 10    PT Start Time [phone]    PT Stop Time 1025    PT Time Calculation (min) 38 min    Activity Tolerance Patient tolerated treatment well;Patient limited by pain    Behavior During Therapy [facility] for tasks assessed/performed           Past Medical History:  Diagnosis Date  . Headache   . History of kidney stones   . Lymphedema   . PONV (postoperative nausea and vomiting)     Past Surgical History:  Procedure Laterality Date  . ABDOMINAL HYSTERECTOMY    . CHOLECYSTECTOMY    . COLONOSCOPY N/A 02/19/2017   Procedure: COLONOSCOPY;  Surgeon: Rogene Houston, MD;  [facility];  Service: Endoscopy;  Laterality: N/A;  7:30  . ESCHAROTOMY    . ESOPHAGEAL DILATION N/A 02/19/2017   Procedure: ESOPHAGEAL DILATION;  Surgeon: Rogene Houston, MD;  [facility];  Service: Endoscopy;  Laterality: N/A;  . ESOPHAGOGASTRODUODENOSCOPY N/A 02/19/2017   Procedure: ESOPHAGOGASTRODUODENOSCOPY (EGD);  Surgeon: Rogene Houston, MD;  [facility];  Service: Endoscopy;  Laterality: N/A;  . SKIN GRAFT Right    leg and arm    There were no vitals filed for this visit.   Subjective Assessment - 09/21/19 0949    Subjective Patient reports that she has been feeling better recently and is not having pain, but still having numbness. Patietn reported that she has begun  doing exercises at home in her pool.    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 No/denies              [facility] PT Assessment - 09/21/19 0001      Assessment   Medical Diagnosis Acute Left-sided LBP with Left-sided sciatica    Referring Provider (PT) Donne Hazel, MD    Onset Date/Surgical Date 08/28/19      ROM / Strength   AROM / PROM / Strength PROM      PROM   PROM Assessment Site Hip    Right/Left Hip Left    Left Hip Flexion WFL    Left Hip External Rotation  --   [facility]   Left Hip Internal Rotation  --   [facility]                        [facility] Adult PT Treatment/Exercise - 09/21/19 0001      Lumbar Exercises: Stretches   Lower Trunk Rotation 10 seconds    Hip Flexor  Stretch Left;3 reps;30 seconds    Hip Flexor Stretch Limitations Modified Thomas Test       Lumbar Exercises: Supine   Other Supine Lumbar Exercises SIJ correction isometric - hip abd/add 2x5 5" holds Each - hook lying    Other Supine Lumbar Exercises HS isometric on small green physioball 15x10''. lumbar roll in supine position - 2 minutes with ab set.       Manual Therapy   Manual Therapy Joint mobilization;Soft tissue mobilization;Manual Traction    Manual therapy comments completed seperate from all other aspects of treatmdnt     Joint Mobilization CPA to T10-L5 grade II/II - felt good. T11-12 painful with CPAs, performed rotational PAs instead, which patient reported felt good.      Soft tissue mobilization STM to left QL, lumbar/thoracic paraspinals     Manual Traction long axis traction of left hip - felt good                     PT Short Term Goals - 09/05/19 0827      PT SHORT TERM GOAL #1   Title Patient will report understanding and  regular compliance with HEP to improve strength, mobility, and decrease pain.    Time 3    Period Weeks    Status New    Target Date 09/26/19      PT SHORT TERM GOAL #2   Title Patient will report an overall improvement of at least 25% in subjective complaint to improve QoL.    Time 3    Period Weeks    Status New    Target Date 09/26/19             PT Long Term Goals - 09/06/19 0630      PT LONG TERM GOAL #1   Title Patient will report an overall improvement of at least 50% in subjective complaint to improve QoL.    Time 6    Period Weeks    Status New    Target Date 10/17/19      PT LONG TERM GOAL #2   Title Patient will demonstrate ability to sit with equal weight distribution on each side with minimal to no increase in pain in order to eat meals with family with improved ease.    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    Time 6    Period Weeks    Status New    Target Date 10/17/19                 Plan - 09/21/19 1036    Clinical Impression Statement Patient reported no pain currently or over the last several days. Continued with a focus on isometrics for alignment as well as manual therapy as the patient reported benefits from this in previous sessions. Patient with some pain with CPA around T11-12, so performed rotational PAs with decreased pain with this. Patient reported feeling good at the end of the session.    Personal Factors and Comorbidities Comorbidity 1    Comorbidities see above    Examination-Activity Limitations Stand;Bathing;Bed Mobility;Locomotion Level;Toileting;Bend;Transfers;Sit;Sleep;Squat;Stairs    Examination-Participation Restrictions Yard Work;Meal Prep;Community Activity;Laundry    Stability/Clinical  Decision  Making Evolving/Moderate complexity    Rehab Potential Good    PT Frequency 2x / week    PT Duration 6 weeks    PT Treatment/Interventions ADLs/Self Care Home Management;Aquatic Therapy;Cryotherapy;Electrical Stimulation;Moist Heat;Traction;DME Instruction;Gait training;Stair training;Functional mobility training;Therapeutic activities;Therapeutic exercise;Balance training;Neuromuscular re-education;Patient/family education;Orthotic Fit/Training;Manual techniques;Passive range of motion;Dry needling;Taping    PT Next Visit Plan Assess SI as needed. Continue isometrics as needed.  Consider soft tissue mobilization for lumbar paraspinals, hip adductors and hip flexors for pain relief.    PT Home Exercise Plan 09/05/19: hip abduction isometrics 10x10'' with belt; back extension, abset, Lt leg over bed x 1 minute for SI; 6/2 lumbar roll, lumbar rotaiton           Patient will benefit from skilled therapeutic intervention in order to improve the following deficits and impairments:  Impaired sensation, Pain, Decreased mobility, Postural dysfunction, Decreased activity tolerance, Decreased endurance, Decreased range of motion, Decreased strength, Hypomobility, Difficulty walking  Visit Diagnosis: Pain in left leg  Muscle weakness (generalized)  Acute left-sided low back pain, unspecified whether sciatica present     Problem List Patient Active Problem List   Diagnosis Date Noted  . Syringohydromyelia ([place]) 09/01/2019  . Possible Lumbar cord compression ([place]) 08/30/2019  . Low back pain-Possible Spinal Cord Compression--saddle anesthesia, urinary incontinence, constipation, rectal sphincter tone problems, back pain and leg weakness 08/30/2019  . GERD (gastroesophageal reflux disease) 08/30/2019  . Esophageal dysphagia 01/19/2017  . Family hx of colon cancer 01/19/2017   Clarene Critchley PT, DPT 10:47 AM, 09/21/19 [place], [place], [address] Phone: [phone]   Fax:  [phone]  Name: Janice Ibarra MRN: [id number] Date of Birth: 03/21/1976

## 2019-09-22 ENCOUNTER — Ambulatory Visit (HOSPITAL_COMMUNITY): Payer: BC Managed Care – PPO | Admitting: Physical Therapy

## 2019-09-22 ENCOUNTER — Telehealth (HOSPITAL_COMMUNITY): Payer: Self-pay | Admitting: Physical Therapy

## 2019-09-22 NOTE — Telephone Encounter (Signed)
pt called to cx this appt due to work

## 2019-09-22 NOTE — Telephone Encounter (Signed)
No Show:  #1  Called pt left message explaining no show policy and reminded pt  That her next appointment was on June 15th.   Rayetta Humphrey, [place] CLT [phone]

## 2019-09-26 ENCOUNTER — Other Ambulatory Visit: Payer: Self-pay

## 2019-09-26 ENCOUNTER — Encounter (HOSPITAL_COMMUNITY): Payer: Self-pay | Admitting: Physical Therapy

## 2019-09-26 ENCOUNTER — Ambulatory Visit (HOSPITAL_COMMUNITY): Payer: BC Managed Care – PPO | Admitting: Physical Therapy

## 2019-09-26 DIAGNOSIS — M79605 Pain in left leg: Secondary | ICD-10-CM | POA: Diagnosis not present

## 2019-09-26 DIAGNOSIS — M545 Low back pain, unspecified: Secondary | ICD-10-CM

## 2019-09-26 DIAGNOSIS — M6281 Muscle weakness (generalized): Secondary | ICD-10-CM

## 2019-09-26 NOTE — Therapy (Signed)
[place], [place], [address] Phone: [phone]   Fax:  [phone]  Physical Therapy Treatment  Patient Details  Name: Janice Ibarra MRN: [id number] Date of Birth: 10-21-1975 Referring Provider (PT): Donne Hazel, MD   Encounter Date: 09/26/2019   PT End of Session - 09/26/19 1533    Visit Number 6    Number of Visits 12    Date for PT Re-Evaluation 10/17/19    Authorization Type [employer] Comm PPO (30 combined visit limit PT/OT)    Authorization - Visit Number 6    Authorization - Number of Visits 30    Progress Note Due on Visit 10    PT Start Time 1435    PT Stop Time 1517    PT Time Calculation (min) 42 min    Activity Tolerance Patient tolerated treatment well;Patient limited by pain    Behavior During Therapy [facility] for tasks assessed/performed           Past Medical History:  Diagnosis Date  . Headache   . History of kidney stones   . Lymphedema   . PONV (postoperative nausea and vomiting)     Past Surgical History:  Procedure Laterality Date  . ABDOMINAL HYSTERECTOMY    . CHOLECYSTECTOMY    . COLONOSCOPY N/A 02/19/2017   Procedure: COLONOSCOPY;  Surgeon: Rogene Houston, MD;  [facility];  Service: Endoscopy;  Laterality: N/A;  7:30  . ESCHAROTOMY    . ESOPHAGEAL DILATION N/A 02/19/2017   Procedure: ESOPHAGEAL DILATION;  Surgeon: Rogene Houston, MD;  [facility];  Service: Endoscopy;  Laterality: N/A;  . ESOPHAGOGASTRODUODENOSCOPY N/A 02/19/2017   Procedure: ESOPHAGOGASTRODUODENOSCOPY (EGD);  Surgeon: Rogene Houston, MD;  [facility];  Service: Endoscopy;  Laterality: N/A;  . SKIN GRAFT Right    leg and arm    There were no vitals filed for this visit.   Subjective Assessment - 09/26/19 1438    Subjective Patient reported feeling good in her back, but still having some numbness. She also reported having an itching feeling in the same area that she is  having numbness.    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 No/denies                             [facility] Adult PT Treatment/Exercise - 09/26/19 0001      Lumbar Exercises: Stretches   Lower Trunk Rotation 10 seconds   10x   Hip Flexor Stretch Left;3 reps;30 seconds    Hip Flexor Stretch Limitations Modified Thomas Test       Lumbar Exercises: Supine   Other Supine Lumbar Exercises Marching over 6'' hurdle with LT LE x10 with ab set. SIJ correction isometric - hip abd/add 2x5 5" holds Each - hook lying      Manual Therapy   Manual Therapy Joint mobilization;Soft tissue mobilization;Manual Traction    Manual therapy comments completed seperate from all other aspects of treatmdnt     Joint Mobilization CPA to T10-L5 grade II/II - felt good. T11-12 painful with CPAs, performed rotational PAs instead, which patient reported felt good.  Soft tissue mobilization STM to left QL, lumbar/thoracic paraspinals     Manual Traction long axis traction of left hip - felt good                     PT Short Term Goals - 09/05/19 0827      PT SHORT TERM GOAL #1   Title Patient will report understanding and regular compliance with HEP to improve strength, mobility, and decrease pain.    Time 3    Period Weeks    Status New    Target Date 09/26/19      PT SHORT TERM GOAL #2   Title Patient will report an overall improvement of at least 25% in subjective complaint to improve QoL.    Time 3    Period Weeks    Status New    Target Date 09/26/19             PT Long Term Goals - 09/06/19 4081      PT LONG TERM GOAL #1   Title Patient will report an overall improvement of at least 50% in subjective complaint to improve QoL.      Time 6    Period Weeks    Status New    Target Date 10/17/19      PT LONG TERM GOAL #2   Title Patient will demonstrate ability to sit with equal weight distribution on each side with minimal to no increase in pain in order to eat meals with family with improved ease.    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    Time 6    Period Weeks    Status New    Target Date 10/17/19                 Plan - 09/26/19 1535    Clinical Impression Statement Patient without pain, but reporting some sensation change in the same area of the numbness including an itching feeling. Added seated marching this session for hip flexor activation. Patient with increased discomfort, however, overall able to perform with good form. Ended session with manual therapy, with patient reporting feeling good at the end of the session.    Personal Factors and Comorbidities Comorbidity 1    Comorbidities see above    Examination-Activity Limitations Stand;Bathing;Bed Mobility;Locomotion Level;Toileting;Bend;Transfers;Sit;Sleep;Squat;Stairs    Examination-Participation Restrictions Yard Work;Meal Prep;Community Activity;Laundry    Stability/Clinical Decision Making Evolving/Moderate complexity    Rehab Potential Good    PT Frequency 2x / week    PT Duration 6 weeks    PT Treatment/Interventions ADLs/Self Care Home Management;Aquatic Therapy;Cryotherapy;Electrical Stimulation;Moist Heat;Traction;DME Instruction;Gait training;Stair training;Functional mobility training;Therapeutic activities;Therapeutic exercise;Balance training;Neuromuscular re-education;Patient/family education;Orthotic Fit/Training;Manual techniques;Passive range of motion;Dry needling;Taping    PT Next Visit Plan Assess SI as  needed. Continue isometrics as needed.  Consider soft tissue mobilization for lumbar paraspinals, hip adductors and hip flexors for pain relief.    PT Home Exercise Plan 09/05/19: hip abduction isometrics 10x10'' with belt; back extension, abset, Lt leg over bed x 1 minute for SI; 6/2 lumbar roll, lumbar rotaiton           Patient will benefit from skilled therapeutic intervention in  order to improve the following deficits and impairments:  Impaired sensation, Pain, Decreased mobility, Postural dysfunction, Decreased activity tolerance, Decreased endurance, Decreased range of motion, Decreased strength, Hypomobility, Difficulty walking  Visit Diagnosis: Pain in left leg  Muscle weakness (generalized)  Acute left-sided low back pain, unspecified whether sciatica present     Problem List Patient Active Problem List   Diagnosis Date Noted  . Syringohydromyelia ([place]) 09/01/2019  . Possible Lumbar cord compression ([place]) 08/30/2019  . Low back pain-Possible Spinal Cord Compression--saddle anesthesia, urinary incontinence, constipation, rectal sphincter tone problems, back pain and leg weakness 08/30/2019  . GERD (gastroesophageal reflux disease) 08/30/2019  . Esophageal dysphagia 01/19/2017  . Family hx of colon cancer 01/19/2017   Clarene Critchley PT, DPT 3:37 PM, 09/26/19 [place], [place], [address] Phone: [phone]   Fax:  [phone]  Name: Janice Ibarra MRN: [id number] Date of Birth: 01/25/1976

## 2019-09-28 ENCOUNTER — Other Ambulatory Visit: Payer: Self-pay

## 2019-09-28 ENCOUNTER — Ambulatory Visit (HOSPITAL_COMMUNITY): Payer: BC Managed Care – PPO | Admitting: Physical Therapy

## 2019-09-28 ENCOUNTER — Encounter (HOSPITAL_COMMUNITY): Payer: Self-pay | Admitting: Physical Therapy

## 2019-09-28 DIAGNOSIS — M545 Low back pain, unspecified: Secondary | ICD-10-CM

## 2019-09-28 DIAGNOSIS — M6281 Muscle weakness (generalized): Secondary | ICD-10-CM

## 2019-09-28 DIAGNOSIS — M79605 Pain in left leg: Secondary | ICD-10-CM | POA: Diagnosis not present

## 2019-09-28 NOTE — Therapy (Signed)
[place], [place], [address] Phone: [phone]   Fax:  [phone]  Physical Therapy Treatment  Patient Details  Name: Janice Ibarra MRN: [id number] Date of Birth: Sep 02, 1975 Referring Provider (PT): Donne Hazel, MD   Encounter Date: 09/28/2019   PT End of Session - 09/28/19 1557    Visit Number 7    Number of Visits 12    Date for PT Re-Evaluation 10/17/19    Authorization Type [employer] Comm PPO (30 combined visit limit PT/OT)    Authorization - Visit Number 7    Authorization - Number of Visits 30    Progress Note Due on Visit 10    PT Start Time 3299    PT Stop Time 2426    PT Time Calculation (min) 39 min    Activity Tolerance Patient tolerated treatment well;Patient limited by pain    Behavior During Therapy [facility] for tasks assessed/performed           Past Medical History:  Diagnosis Date  . Headache   . History of kidney stones   . Lymphedema   . PONV (postoperative nausea and vomiting)     Past Surgical History:  Procedure Laterality Date  . ABDOMINAL HYSTERECTOMY    . CHOLECYSTECTOMY    . COLONOSCOPY N/A 02/19/2017   Procedure: COLONOSCOPY;  Surgeon: Rogene Houston, MD;  [facility];  Service: Endoscopy;  Laterality: N/A;  7:30  . ESCHAROTOMY    . ESOPHAGEAL DILATION N/A 02/19/2017   Procedure: ESOPHAGEAL DILATION;  Surgeon: Rogene Houston, MD;  [facility];  Service: Endoscopy;  Laterality: N/A;  . ESOPHAGOGASTRODUODENOSCOPY N/A 02/19/2017   Procedure: ESOPHAGOGASTRODUODENOSCOPY (EGD);  Surgeon: Rogene Houston, MD;  [facility];  Service: Endoscopy;  Laterality: N/A;  . SKIN GRAFT Right    leg and arm    There were no vitals filed for this visit.   Subjective Assessment - 09/28/19 1538    Subjective Pt states that she is still having numbness down the lateral aspect of her leg but she is not having any pain at this time.    Pertinent History  Onset of acute LBP and left leg pain on 08/28/19    Limitations Standing;Walking;Sitting    How long can you sit comfortably? 10-15 minutes    How long can you stand comfortably? 10-15 minutes    How long can you walk comfortably? 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 No/denies                             [facility] Adult PT Treatment/Exercise - 09/28/19 0001      Exercises   Exercises Lumbar      Lumbar Exercises: Stretches   Hip Flexor Stretch Right;Left;3 reps;30 seconds    Hip Flexor Stretch Limitations Modified Thomas Test     Prone on Elbows Stretch 1 rep;60 seconds    Other Lumbar Stretch Exercise 3 D hip excursions       Lumbar Exercises: Seated   Sit to Stand 10 reps      Lumbar Exercises: Supine   Dead Bug 10 reps    Bridge 10 reps;3 seconds    Isometric  Hip Flexion 10 reps;3 seconds   reports increased symptoms   Other Supine Lumbar Exercises hip ab/adduction x 10       Lumbar Exercises: Sidelying   Hip Abduction Both;10 reps      Lumbar Exercises: Prone   Straight Leg Raise 10 reps    Opposite Arm/Leg Raise Right arm/Left leg;Left arm/Right leg;5 reps      Manual Therapy   Manual Therapy Joint mobilization;Soft tissue mobilization;Manual Traction    Manual therapy comments completed seperate from all other aspects of treatmdnt     Muscle Energy Technique to correct posterior rotatiion of LT SI                     PT Short Term Goals - 09/28/19 1610      PT SHORT TERM GOAL #1   Title Patient will report understanding and regular compliance with HEP to improve strength, mobility, and decrease pain.    Time 3    Period Weeks    Status Achieved    Target Date 09/26/19      PT SHORT TERM GOAL #2   Title Patient will report an overall improvement  of at least 25% in subjective complaint to improve QoL.    Time 3    Period Weeks    Status Achieved    Target Date 09/26/19             PT Long Term Goals - 09/28/19 1610      PT LONG TERM GOAL #1   Title Patient will report an overall improvement of at least 50% in subjective complaint to improve QoL.    Time 6    Period Weeks    Status Achieved      PT LONG TERM GOAL #2   Title Patient will demonstrate ability to sit with equal weight distribution on each side with minimal to no increase in pain in order to eat meals with family with improved ease.    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    Time 6    Period Weeks    Status On-going                 Plan - 09/28/19 1557    Clinical Impression Statement PT continues to have numbness but no pain.  Progressed pt with higher stabilization exercises with verbal and manual cuing needed for proper technique.  SI checked with adjustment needed.    Personal Factors and Comorbidities Comorbidity 1    Comorbidities see above    Examination-Activity Limitations Stand;Bathing;Bed Mobility;Locomotion Level;Toileting;Bend;Transfers;Sit;Sleep;Squat;Stairs    Examination-Participation Restrictions Yard Work;Meal Prep;Community Activity;Laundry    Stability/Clinical Decision Making Evolving/Moderate complexity    Rehab Potential Good    PT Frequency 2x / week    PT Duration 6 weeks    PT Treatment/Interventions ADLs/Self Care Home Management;Aquatic Therapy;Cryotherapy;Electrical Stimulation;Moist Heat;Traction;DME Instruction;Gait training;Stair training;Functional mobility training;Therapeutic activities;Therapeutic exercise;Balance training;Neuromuscular re-education;Patient/family education;Orthotic Fit/Training;Manual  techniques;Passive range of motion;Dry needling;Taping    PT Next Visit Plan Assess SI as needed. Continue isometrics as needed.  Consider soft tissue mobilization for lumbar paraspinals, hip adductors and hip flexors for pain relief.    PT Home Exercise Plan 09/05/19: hip abduction isometrics 10x10''  with belt; back extension, abset, Lt leg over bed x 1 minute for SI; 6/2 lumbar roll, lumbar rotaiton           Patient will benefit from skilled therapeutic intervention in order to improve the following deficits and impairments:  Impaired sensation, Pain, Decreased mobility, Postural dysfunction, Decreased activity tolerance, Decreased endurance, Decreased range of motion, Decreased strength, Hypomobility, Difficulty walking  Visit Diagnosis: Pain in left leg  Muscle weakness (generalized)  Acute left-sided low back pain, unspecified whether sciatica present     Problem List Patient Active Problem List   Diagnosis Date Noted  . Syringohydromyelia ([place]) 09/01/2019  . Possible Lumbar cord compression ([place]) 08/30/2019  . Low back pain-Possible Spinal Cord Compression--saddle anesthesia, urinary incontinence, constipation, rectal sphincter tone problems, back pain and leg weakness 08/30/2019  . GERD (gastroesophageal reflux disease) 08/30/2019  . Esophageal dysphagia 01/19/2017  . Family hx of colon cancer 01/19/2017   Rayetta Humphrey, PT CLT [phone] 09/28/2019, 4:13 PM  [facility] [address] [place], [place], [address] Phone: [phone]   Fax:  [phone]  Name: Janice Ibarra MRN: [id number] Date of Birth: [birth year]

## 2019-10-03 ENCOUNTER — Encounter (HOSPITAL_COMMUNITY): Payer: Self-pay | Admitting: Physical Therapy

## 2019-10-03 ENCOUNTER — Ambulatory Visit (HOSPITAL_COMMUNITY): Payer: BC Managed Care – PPO | Admitting: Physical Therapy

## 2019-10-03 ENCOUNTER — Other Ambulatory Visit: Payer: Self-pay

## 2019-10-03 DIAGNOSIS — M545 Low back pain, unspecified: Secondary | ICD-10-CM

## 2019-10-03 DIAGNOSIS — M6281 Muscle weakness (generalized): Secondary | ICD-10-CM

## 2019-10-03 DIAGNOSIS — M79605 Pain in left leg: Secondary | ICD-10-CM

## 2019-10-03 NOTE — Therapy (Signed)
[place], [place], [address] Phone: [phone]   Fax:  [phone]  Physical Therapy Treatment  Patient Details  Name: Janice Ibarra MRN: [id number] Date of Birth: 29-Jul-1975 Referring Provider (PT): Donne Hazel, MD   Encounter Date: 10/03/2019   PT End of Session - 10/03/19 1652    Visit Number 8    Number of Visits 12    Date for PT Re-Evaluation 10/17/19    Authorization Type [employer] Comm PPO (30 combined visit limit PT/OT)    Authorization - Visit Number 8    Authorization - Number of Visits 30    Progress Note Due on Visit 10    PT Start Time 1648    PT Stop Time 1726    PT Time Calculation (min) 38 min    Activity Tolerance Patient tolerated treatment well;Patient limited by pain    Behavior During Therapy [facility] for tasks assessed/performed           Past Medical History:  Diagnosis Date  . Headache   . History of kidney stones   . Lymphedema   . PONV (postoperative nausea and vomiting)     Past Surgical History:  Procedure Laterality Date  . ABDOMINAL HYSTERECTOMY    . CHOLECYSTECTOMY    . COLONOSCOPY N/A 02/19/2017   Procedure: COLONOSCOPY;  Surgeon: Rogene Houston, MD;  [facility];  Service: Endoscopy;  Laterality: N/A;  7:30  . ESCHAROTOMY    . ESOPHAGEAL DILATION N/A 02/19/2017   Procedure: ESOPHAGEAL DILATION;  Surgeon: Rogene Houston, MD;  [facility];  Service: Endoscopy;  Laterality: N/A;  . ESOPHAGOGASTRODUODENOSCOPY N/A 02/19/2017   Procedure: ESOPHAGOGASTRODUODENOSCOPY (EGD);  Surgeon: Rogene Houston, MD;  [facility];  Service: Endoscopy;  Laterality: N/A;  . SKIN GRAFT Right    leg and arm    There were no vitals filed for this visit.   Subjective Assessment - 10/03/19 1650    Subjective Patient reported that her pain has been pretty good recently.    Pertinent History Onset of acute LBP and left leg pain on 08/28/19    Limitations  Standing;Walking;Sitting    How long can you sit comfortably? 10-15 minutes    How long can you stand comfortably? 10-15 minutes    How long can you walk comfortably? 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 No/denies                             [facility] Adult PT Treatment/Exercise - 10/03/19 0001      Lumbar Exercises: Stretches   Hip Flexor Stretch Right;Left;3 reps;30 seconds    Hip Flexor Stretch Limitations Modified Thomas Test     Prone on Elbows Stretch 60 seconds;2 reps      Lumbar Exercises: Supine   Large Ball Abdominal Isometric 10 reps    Large Ball Abdominal Isometric Limitations Abdominal and hip flexor isometric green ball, feet in hooklying    Other Supine Lumbar Exercises Butterfly wing figure 4 Internal hip rotation x 10      Manual Therapy   Manual Therapy Manual Traction;Passive ROM    Passive ROM Into hip flexion for glute stretch x10 reps  Manual Traction long axis traction of left hip - felt good                     PT Short Term Goals - 09/28/19 1610      PT SHORT TERM GOAL #1   Title Patient will report understanding and regular compliance with HEP to improve strength, mobility, and decrease pain.    Time 3    Period Weeks    Status Achieved    Target Date 09/26/19      PT SHORT TERM GOAL #2   Title Patient will report an overall improvement of at least 25% in subjective complaint to improve QoL.    Time 3    Period Weeks    Status Achieved    Target Date 09/26/19             PT Long Term Goals - 09/28/19 1610      PT LONG TERM GOAL #1   Title Patient will report an overall improvement of at least 50% in subjective complaint to improve QoL.    Time 6    Period Weeks    Status Achieved      PT LONG TERM GOAL #2   Title  Patient will demonstrate ability to sit with equal weight distribution on each side with minimal to no increase in pain in order to eat meals with family with improved ease.    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    Time 6    Period Weeks    Status On-going                 Plan - 10/03/19 1746    Clinical Impression Statement Focused on activating hip flexor this session as well as stretching the gluteals. Added PROM for hip flexion to improve mobility to and provide a glute stretch without aggravating the hip flexor. Patient tolerated this well. Also added a figure 4 hip IR AROM exercise which patient tolerated well as well and did not aggravate her pain. Ended session with long axis hip distraction which patient reported felt good and helped decrease her pain.    Personal Factors and Comorbidities Comorbidity 1    Comorbidities see above    Examination-Activity Limitations Stand;Bathing;Bed Mobility;Locomotion Level;Toileting;Bend;Transfers;Sit;Sleep;Squat;Stairs    Examination-Participation Restrictions Yard Work;Meal Prep;Community Activity;Laundry    Stability/Clinical Decision Making Evolving/Moderate complexity    Rehab Potential Good    PT Frequency 2x / week    PT Duration 6 weeks    PT Treatment/Interventions ADLs/Self Care Home Management;Aquatic Therapy;Cryotherapy;Electrical Stimulation;Moist Heat;Traction;DME Instruction;Gait training;Stair training;Functional mobility training;Therapeutic activities;Therapeutic exercise;Balance training;Neuromuscular re-education;Patient/family education;Orthotic Fit/Training;Manual techniques;Passive range of motion;Dry needling;Taping    PT Next Visit Plan Assess SI as needed. Continue isometrics as needed.  Consider soft tissue  mobilization for lumbar paraspinals, hip adductors and hip flexors for pain relief.    PT Home Exercise Plan 09/05/19: hip abduction isometrics 10x10'' with belt; back extension, abset, Lt leg over bed x 1 minute for SI; 6/2 lumbar roll, lumbar rotaiton           Patient will benefit from skilled therapeutic intervention in order to improve the following deficits and impairments:  Impaired sensation, Pain, Decreased mobility,  Postural dysfunction, Decreased activity tolerance, Decreased endurance, Decreased range of motion, Decreased strength, Hypomobility, Difficulty walking  Visit Diagnosis: Pain in left leg  Muscle weakness (generalized)  Acute left-sided low back pain, unspecified whether sciatica present     Problem List Patient Active Problem List   Diagnosis Date Noted  . Syringohydromyelia ([place]) 09/01/2019  . Possible Lumbar cord compression ([place]) 08/30/2019  . Low back pain-Possible Spinal Cord Compression--saddle anesthesia, urinary incontinence, constipation, rectal sphincter tone problems, back pain and leg weakness 08/30/2019  . GERD (gastroesophageal reflux disease) 08/30/2019  . Esophageal dysphagia 01/19/2017  . Family hx of colon cancer 01/19/2017   Clarene Critchley PT, DPT 5:47 PM, 10/03/19 [place], [place], [address] Phone: [phone]   Fax:  [phone]  Name: JACKELIN CORREIA MRN: [id number] Date of Birth: 01/30/1976

## 2019-10-05 ENCOUNTER — Other Ambulatory Visit: Payer: Self-pay

## 2019-10-05 ENCOUNTER — Ambulatory Visit (HOSPITAL_COMMUNITY): Payer: BC Managed Care – PPO | Admitting: Physical Therapy

## 2019-10-05 ENCOUNTER — Encounter (HOSPITAL_COMMUNITY): Payer: Self-pay | Admitting: Physical Therapy

## 2019-10-05 DIAGNOSIS — M79605 Pain in left leg: Secondary | ICD-10-CM

## 2019-10-05 DIAGNOSIS — M6281 Muscle weakness (generalized): Secondary | ICD-10-CM

## 2019-10-05 DIAGNOSIS — M545 Low back pain, unspecified: Secondary | ICD-10-CM

## 2019-10-05 NOTE — Therapy (Signed)
[place], [place], [address] Phone: [phone]   Fax:  [phone]  Physical Therapy Treatment  Patient Details  Name: Janice Ibarra MRN: [id number] Date of Birth: April 27, 1975 Referring Provider (PT): Donne Hazel, MD   Encounter Date: 10/05/2019   PT End of Session - 10/05/19 1354    Visit Number 9    Number of Visits 12    Date for PT Re-Evaluation 10/17/19    Authorization Type [employer] P[employer] (30 combined visit limit PT/OT)    Authorization - Visit Number 9    Authorization - Number of Visits 30    Progress Note Due on Visit 10    PT Start Time 1350    PT Stop Time 1425    PT Time Calculation (min) 35 min    Activity Tolerance Patient tolerated treatment well;Patient limited by pain    Behavior During Therapy [facility] for tasks assessed/performed           Past Medical History:  Diagnosis Date  . Headache   . History of kidney stones   . Lymphedema   . PONV (postoperative nausea and vomiting)     Past Surgical History:  Procedure Laterality Date  . ABDOMINAL HYSTERECTOMY    . CHOLECYSTECTOMY    . COLONOSCOPY N/A 02/19/2017   Procedure: COLONOSCOPY;  Surgeon: Rogene Houston, MD;  Location: [facility];  Service: Endoscopy;  Laterality: N/A;  7:30  . ESCHAROTOMY    . ESOPHAGEAL DILATION N/A 02/19/2017   Procedure: ESOPHAGEAL DILATION;  Surgeon: Rogene Houston, MD;  Location: [facility];  Service: Endoscopy;  Laterality: N/A;  . ESOPHAGOGASTRODUODENOSCOPY N/A 02/19/2017   Procedure: ESOPHAGOGASTRODUODENOSCOPY (EGD);  Surgeon: Rogene Houston, MD;  Location: [facility];  Service: Endoscopy;  Laterality: N/A;  . SKIN GRAFT Right    leg and arm    There were no vitals filed for this visit.   Subjective Assessment - 10/05/19 1353    Subjective Patient reported that the only pain she has is that she has trouble with lifting her leg such as when she is dressing.    Pertinent History Onset  of acute LBP and left leg pain on 08/28/19    Limitations Standing;Walking;Sitting    How long can you sit comfortably? 10-15 minutes    How long can you stand comfortably? 10-15 minutes    How long can you walk comfortably? 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 No/denies                             [facility] Adult PT Treatment/Exercise - 10/05/19 0001      Lumbar Exercises: Stretches   Hip Flexor Stretch Right;Left;3 reps;30 seconds    Hip Flexor Stretch Limitations Modified Thomas Test     Prone on Elbows Stretch 60 seconds;2 reps      Lumbar Exercises: Supine   Large Ball Abdominal Isometric 10 reps    Large Ball Abdominal Isometric Limitations Abdominal and hip flexor isometric green ball, feet in hooklying   5-10 seconds   Other Supine Lumbar Exercises Butterfly wing figure 4 Internal hip rotation x 10      Manual Therapy   Manual Therapy  Manual Traction;Passive ROM    Passive ROM Into hip flexion for glute stretch x10 reps    Manual Traction long axis traction of left hip - felt good                     PT Short Term Goals - 09/28/19 1610      PT SHORT TERM GOAL #1   Title Patient will report understanding and regular compliance with HEP to improve strength, mobility, and decrease pain.    Time 3    Period Weeks    Status Achieved    Target Date 09/26/19      PT SHORT TERM GOAL #2   Title Patient will report an overall improvement of at least 25% in subjective complaint to improve QoL.    Time 3    Period Weeks    Status Achieved    Target Date 09/26/19             PT Long Term Goals - 09/28/19 1610      PT LONG TERM GOAL #1   Title Patient will report an overall improvement of at least 50% in subjective complaint to improve QoL.    Time 6     Period Weeks    Status Achieved      PT LONG TERM GOAL #2   Title Patient will demonstrate ability to sit with equal weight distribution on each side with minimal to no increase in pain in order to eat meals with family with improved ease.    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    Time 6    Period Weeks    Status On-going                 Plan - 10/05/19 1431    Clinical Impression Statement Focused on mobility and manual therapy this session. Continued with PROM of the left hip into flexion this session. Patient reported that following therapy she feels like she can start to feel her sensation in her leg being more aware, but that it does not continue. Continued with IR of the left hip in the butterfly position for muscle activation as well as isometric with the physioball for hip flexion activation. Patient reported feeling good at the end of the session.    Personal Factors and Comorbidities Comorbidity 1    Comorbidities see above    Examination-Activity Limitations Stand;Bathing;Bed Mobility;Locomotion Level;Toileting;Bend;Transfers;Sit;Sleep;Squat;Stairs    Examination-Participation Restrictions Yard Work;Meal Prep;Community Activity;Laundry    Stability/Clinical Decision Making Evolving/Moderate complexity    Rehab Potential Good    PT Frequency 2x / week    PT Duration 6 weeks    PT Treatment/Interventions ADLs/Self Care Home Management;Aquatic Therapy;Cryotherapy;Electrical Stimulation;Moist Heat;Traction;DME Instruction;Gait training;Stair training;Functional mobility training;Therapeutic activities;Therapeutic exercise;Balance training;Neuromuscular re-education;Patient/family education;Orthotic Fit/Training;Manual techniques;Passive range of motion;Dry needling;Taping     PT Next Visit Plan PN next session. Continue isometrics.  Manual for mobility and pain relief    PT Home Exercise Plan 09/05/19: hip abduction isometrics 10x10'' with belt; back extension, abset, Lt leg over bed x 1 minute for SI; 6/2 lumbar roll, lumbar rotaiton           Patient will benefit from skilled therapeutic intervention in  order to improve the following deficits and impairments:  Impaired sensation, Pain, Decreased mobility, Postural dysfunction, Decreased activity tolerance, Decreased endurance, Decreased range of motion, Decreased strength, Hypomobility, Difficulty walking  Visit Diagnosis: Pain in left leg  Muscle weakness (generalized)  Acute left-sided low back pain, unspecified whether sciatica present     Problem List Patient Active Problem List   Diagnosis Date Noted  . Syringohydromyelia ([facility]) 09/01/2019  . Possible Lumbar cord compression ([place]) 08/30/2019  . Low back pain-Possible Spinal Cord Compression--saddle anesthesia, urinary incontinence, constipation, rectal sphincter tone problems, back pain and leg weakness 08/30/2019  . GERD (gastroesophageal reflux disease) 08/30/2019  . Esophageal dysphagia 01/19/2017  . Family hx of colon cancer 01/19/2017   Clarene Critchley PT, DPT 2:33 PM, 10/05/19 [place], [place], [address] Phone: [phone]   Fax:  [phone]  Name: Janice Ibarra MRN: [id number] Date of Birth: 07-20-1975

## 2019-10-10 ENCOUNTER — Ambulatory Visit (HOSPITAL_COMMUNITY): Payer: BC Managed Care – PPO | Admitting: Physical Therapy

## 2019-10-10 ENCOUNTER — Telehealth (HOSPITAL_COMMUNITY): Payer: Self-pay | Admitting: Physical Therapy

## 2019-10-10 NOTE — Telephone Encounter (Signed)
husband called to cx all future apptments - they have a child that is having some issues and Jacqui will call back next week to r/s these missed apptments

## 2019-10-11 ENCOUNTER — Encounter (HOSPITAL_COMMUNITY): Payer: BC Managed Care – PPO

## 2019-10-12 ENCOUNTER — Encounter (HOSPITAL_COMMUNITY): Payer: BC Managed Care – PPO | Admitting: Physical Therapy

## 2019-10-17 ENCOUNTER — Encounter (HOSPITAL_COMMUNITY): Payer: BC Managed Care – PPO | Admitting: Physical Therapy

## 2019-10-19 ENCOUNTER — Encounter (HOSPITAL_COMMUNITY): Payer: BC Managed Care – PPO | Admitting: Physical Therapy

## 2020-01-16 ENCOUNTER — Encounter (HOSPITAL_COMMUNITY): Payer: Self-pay | Admitting: Physical Therapy

## 2020-01-16 NOTE — Therapy (Signed)
[place], [place], [address] Phone: [phone]   Fax:  [phone]  Patient Details  Name: Janice Ibarra MRN: [id number] Date of Birth: 1975-08-26 Referring Provider:  No ref. provider found  Encounter Date: 01/16/2020  PHYSICAL THERAPY DISCHARGE SUMMARY  Visits from Start of Care: 9  Current functional level related to goals / functional outcomes: Unknown as patient did not return to physical therapy   Remaining deficits: Unknown as patient did not return to physical therapy   Education / Equipment: HEP   Plan: Patient agrees to discharge.  Patient goals were partially met. Patient is being discharged due to not returning since the last visit.  ?????     Clarene Critchley PT, DPT 10:47 AM, 01/16/20 [place], [place], [address] Phone: [phone]   Fax:  [phone]

## 2020-03-12 DIAGNOSIS — Z20828 Contact with and (suspected) exposure to other viral communicable diseases: Secondary | ICD-10-CM | POA: Diagnosis not present

## 2020-03-12 DIAGNOSIS — J069 Acute upper respiratory infection, unspecified: Secondary | ICD-10-CM | POA: Diagnosis not present

## 2023-12-20 ENCOUNTER — Emergency Department (HOSPITAL_COMMUNITY)

## 2023-12-20 ENCOUNTER — Other Ambulatory Visit: Payer: Self-pay

## 2023-12-20 ENCOUNTER — Encounter (HOSPITAL_COMMUNITY): Payer: Self-pay

## 2023-12-20 ENCOUNTER — Emergency Department (HOSPITAL_COMMUNITY)
Admission: EM | Admit: 2023-12-20 | Discharge: 2023-12-20 | Disposition: A | Discharge status: Home / Self Care | Attending: Emergency Medicine | Admitting: Emergency Medicine

## 2023-12-20 DIAGNOSIS — R109 Unspecified abdominal pain: Secondary | ICD-10-CM | POA: Diagnosis present

## 2023-12-20 DIAGNOSIS — N132 Hydronephrosis with renal and ureteral calculous obstruction: Secondary | ICD-10-CM | POA: Diagnosis not present

## 2023-12-20 DIAGNOSIS — N200 Calculus of kidney: Secondary | ICD-10-CM

## 2023-12-20 LAB — URINALYSIS, ROUTINE W REFLEX MICROSCOPIC
Bilirubin Urine: NEGATIVE
Glucose, UA: 50 mg/dL — AB
Ketones, ur: NEGATIVE mg/dL
Leukocytes,Ua: NEGATIVE
Nitrite: POSITIVE — AB
Protein, ur: 100 mg/dL — AB
RBC / HPF: >50 RBC/hpf (ref 0–5)
Specific Gravity, Urine: 1.031 — ABNORMAL HIGH (ref 1.005–1.030)
pH: 5 (ref 5.0–8.0)

## 2023-12-20 LAB — CBC
HCT: 40.5 % (ref 36.0–46.0)
Hemoglobin: 13.1 g/dL (ref 12.0–15.0)
MCH: 27.6 pg (ref 26.0–34.0)
MCHC: 32.3 g/dL (ref 30.0–36.0)
MCV: 85.3 fL (ref 80.0–100.0)
Platelets: 394 K/uL (ref 150–400)
RBC: 4.75 MIL/uL (ref 3.87–5.11)
RDW: 13.1 % (ref 11.5–15.5)
WBC: 11 K/uL — ABNORMAL HIGH (ref 4.0–10.5)
nRBC: 0 % (ref 0.0–0.2)

## 2023-12-20 LAB — BASIC METABOLIC PANEL WITH GFR
Anion gap: 13 (ref 5–15)
BUN: 13 mg/dL (ref 6–20)
CO2: 21 mmol/L — ABNORMAL LOW (ref 22–32)
Calcium: 9.6 mg/dL (ref 8.9–10.3)
Chloride: 105 mmol/L (ref 98–111)
Creatinine, Ser: 0.87 mg/dL (ref 0.44–1.00)
GFR, Estimated: >60 mL/min (ref >60)
Glucose, Bld: 116 mg/dL — ABNORMAL HIGH (ref 70–99)
Potassium: 3.6 mmol/L (ref 3.5–5.1)
Sodium: 139 mmol/L (ref 135–145)

## 2023-12-20 MED ORDER — ONDANSETRON HCL 4 MG/2ML IJ SOLN
4.0000 mg | Freq: Once | INTRAMUSCULAR | Status: AC
  Administered 2023-12-20: 4 mg via INTRAVENOUS
  Filled 2023-12-20: qty 2

## 2023-12-20 MED ORDER — MORPHINE SULFATE (PF) 4 MG/ML IV SOLN
4.0000 mg | Freq: Once | INTRAVENOUS | Status: AC
  Administered 2023-12-20: 4 mg via INTRAVENOUS
  Filled 2023-12-20: qty 1

## 2023-12-20 MED ORDER — OXYCODONE-ACETAMINOPHEN 5-325 MG PO TABS
2.0000 | ORAL_TABLET | Freq: Once | ORAL | Status: AC
  Administered 2023-12-20: 2 via ORAL
  Filled 2023-12-20: qty 2

## 2023-12-20 MED ORDER — KETOROLAC TROMETHAMINE 30 MG/ML IJ SOLN
30.0000 mg | Freq: Once | INTRAMUSCULAR | Status: AC
  Administered 2023-12-20: 30 mg via INTRAVENOUS
  Filled 2023-12-20: qty 1

## 2023-12-20 MED ORDER — OXYCODONE-ACETAMINOPHEN 5-325 MG PO TABS: 1.0000 | ORAL_TABLET | Freq: Four times a day (QID) | ORAL | 0 refills | Status: DC | PRN

## 2023-12-20 MED ORDER — TAMSULOSIN HCL 0.4 MG PO CAPS: 0.4000 mg | ORAL_CAPSULE | Freq: Every day | ORAL | 0 refills | Status: AC

## 2023-12-20 NOTE — ED Triage Notes (Signed)
 Pt c/o right flank pain x3 days, reports the pain has gotten progressively worse tonight. Is now having hematuria and N/V.  Hx of kidney stones

## 2023-12-20 NOTE — Discharge Instructions (Addendum)
 Begin taking Percocet as prescribed as needed for pain.  Begin taking Flomax  as prescribed.  Follow-up with urology if symptoms are not improving in the next few days.  The contact information for Dr. Sherrilee has been provided in this discharge summary for you to call and make these arrangements.

## 2023-12-20 NOTE — ED Provider Notes (Signed)
 [place] EMERGENCY DEPARTMENT AT [facility] Provider Note   CSN: [id number] Arrival date & time: 12/20/23  0020     Patient presents with: Flank Pain   Janice Ibarra is a 48 y.o. female.   Is a 48 year old female with history of kidney stones, GERD.  Patient presenting today with complaints of right flank pain.  This started yesterday and is rapidly worsening.  She describes nausea and vomiting and is also noticed blood in her urine.  No fevers or chills.  No aggravating or alleviating factors.  This feels similar to what she experienced with prior kidney stone.       Prior to Admission medications   Medication Sig Start Date End Date Taking? Authorizing Provider  cyclobenzaprine (FLEXERIL) 10 MG tablet Take 1 tablet by mouth every evening. 08/28/19   [provider]  HYDROcodone-acetaminophen  (NORCO) 10-325 MG tablet Take 1 tablet by mouth 4 (four) times daily as needed. 08/28/19   [provider]  ibuprofen (ADVIL,MOTRIN) 200 MG tablet Take 200 mg every 8 (eight) hours as needed by mouth for headache.    [provider]  predniSONE (DELTASONE) 20 MG tablet TAKE TWO TABLETS BY MOUTH FOR SEVEN DAYS, THEN TAKE 1 TABLET FOR SEVEN DAYS 08/28/19   [provider]    Allergies: Penicillins and Erythromycin    Review of Systems  All other systems reviewed and are negative.   Updated Vital Signs BP (!) 121/101   Pulse 67   Temp 97.8 F (36.6 C) (Oral)   Resp (!) 24   SpO2 100%   Physical Exam Vitals and nursing note reviewed.  Constitutional:      General: She is not in acute distress.    Appearance: She is well-developed. She is not diaphoretic.  HENT:     Head: Normocephalic and atraumatic.  Cardiovascular:     Rate and Rhythm: Normal rate and regular rhythm.     Heart sounds: No murmur heard.    No friction rub. No gallop.  Pulmonary:     Effort: Pulmonary effort is normal. No respiratory distress.     Breath sounds:  Normal breath sounds. No wheezing.  Abdominal:     General: Bowel sounds are normal. There is no distension.     Palpations: Abdomen is soft.     Tenderness: There is no abdominal tenderness. There is right CVA tenderness.  Musculoskeletal:        General: Normal range of motion.     Cervical back: Normal range of motion and neck supple.  Skin:    General: Skin is warm and dry.  Neurological:     General: No focal deficit present.     Mental Status: She is alert and oriented to person, place, and time.     (all labs ordered are listed, but only abnormal results are displayed) Labs Reviewed  URINALYSIS, ROUTINE W REFLEX MICROSCOPIC  BASIC METABOLIC PANEL WITH GFR  CBC    EKG: None  Radiology: No results found.   Procedures   Medications Ordered in the ED  ondansetron  (ZOFRAN ) injection 4 mg (has no administration in time range)  ketorolac  (TORADOL ) 30 MG/ML injection 30 mg (has no administration in time range)  morphine  (PF) 4 MG/ML injection 4 mg (has no administration in time range)  Medical Decision Making Amount and/or Complexity of Data Reviewed Labs: ordered. Radiology: ordered.  Risk Prescription drug management.   Patient is a 48 year old female presenting with right flank pain as described in the HPI.  Patient arrives with stable vital signs and is afebrile.  She does have right-sided CVA tenderness and appears uncomfortable on exam.  Laboratory studies obtained including CBC and basic metabolic panel, both of which are unremarkable.  Urinalysis showing blood, but no evidence for infection.  CT scan with renal protocol obtained showing a 4 mm distal right ureteral stone with hydronephrosis.  Patient has received Toradol  and morphine  for pain along with Zofran  for nausea and is feeling significantly improved.  She will be discharged with pain medication and Flomax .  To return as needed for any problems.     Final  diagnoses:  None    ED Discharge Orders     None          Geroldine Berg, MD 12/20/23 [phone]

## 2023-12-21 ENCOUNTER — Emergency Department (HOSPITAL_COMMUNITY): Admitting: Anesthesiology

## 2023-12-21 ENCOUNTER — Emergency Department (HOSPITAL_COMMUNITY)

## 2023-12-21 ENCOUNTER — Other Ambulatory Visit: Payer: Self-pay

## 2023-12-21 ENCOUNTER — Encounter (HOSPITAL_COMMUNITY): Payer: Self-pay | Admitting: *Deleted

## 2023-12-21 ENCOUNTER — Encounter (HOSPITAL_COMMUNITY)
Admission: EM | Disposition: A | Discharge status: Home / Self Care | Payer: Self-pay | Source: Home / Self Care | Attending: Emergency Medicine

## 2023-12-21 ENCOUNTER — Ambulatory Visit (HOSPITAL_COMMUNITY)
Admission: EM | Admit: 2023-12-21 | Discharge: 2023-12-21 | Disposition: A | Discharge status: Home / Self Care | Attending: Urology | Admitting: Urology

## 2023-12-21 DIAGNOSIS — N23 Unspecified renal colic: Secondary | ICD-10-CM | POA: Insufficient documentation

## 2023-12-21 DIAGNOSIS — N132 Hydronephrosis with renal and ureteral calculous obstruction: Secondary | ICD-10-CM | POA: Diagnosis present

## 2023-12-21 DIAGNOSIS — N136 Pyonephrosis: Secondary | ICD-10-CM | POA: Diagnosis not present

## 2023-12-21 DIAGNOSIS — Z87891 Personal history of nicotine dependence: Secondary | ICD-10-CM | POA: Diagnosis not present

## 2023-12-21 DIAGNOSIS — N390 Urinary tract infection, site not specified: Secondary | ICD-10-CM

## 2023-12-21 HISTORY — PX: CYSTOSCOPY W/ URETERAL STENT PLACEMENT: SHX1429

## 2023-12-21 LAB — URINALYSIS, W/ REFLEX TO CULTURE (INFECTION SUSPECTED)
Bilirubin Urine: NEGATIVE
Glucose, UA: NEGATIVE mg/dL
Ketones, ur: NEGATIVE mg/dL
Leukocytes,Ua: NEGATIVE
Nitrite: POSITIVE — AB
Protein, ur: 30 mg/dL — AB
RBC / HPF: >50 RBC/hpf (ref 0–5)
Specific Gravity, Urine: 1.032 — ABNORMAL HIGH (ref 1.005–1.030)
pH: 5 (ref 5.0–8.0)

## 2023-12-21 LAB — BASIC METABOLIC PANEL WITH GFR
Anion gap: 13 (ref 5–15)
BUN: 21 mg/dL — ABNORMAL HIGH (ref 6–20)
CO2: 20 mmol/L — ABNORMAL LOW (ref 22–32)
Calcium: 9.2 mg/dL (ref 8.9–10.3)
Chloride: 107 mmol/L (ref 98–111)
Creatinine, Ser: 1.34 mg/dL — ABNORMAL HIGH (ref 0.44–1.00)
GFR, Estimated: 49 mL/min — ABNORMAL LOW (ref >60)
Glucose, Bld: 115 mg/dL — ABNORMAL HIGH (ref 70–99)
Potassium: 3.6 mmol/L (ref 3.5–5.1)
Sodium: 140 mmol/L (ref 135–145)

## 2023-12-21 LAB — CBC WITH DIFFERENTIAL/PLATELET
Abs Immature Granulocytes: 0.03 K/uL (ref 0.00–0.07)
Basophils Absolute: 0 K/uL (ref 0.0–0.1)
Basophils Relative: 0 %
Eosinophils Absolute: 0.1 K/uL (ref 0.0–0.5)
Eosinophils Relative: 1 %
HCT: 36.2 % (ref 36.0–46.0)
Hemoglobin: 11.8 g/dL — ABNORMAL LOW (ref 12.0–15.0)
Immature Granulocytes: 0 %
Lymphocytes Relative: 18 %
Lymphs Abs: 1.8 K/uL (ref 0.7–4.0)
MCH: 28 pg (ref 26.0–34.0)
MCHC: 32.6 g/dL (ref 30.0–36.0)
MCV: 85.8 fL (ref 80.0–100.0)
Monocytes Absolute: 0.8 K/uL (ref 0.1–1.0)
Monocytes Relative: 8 %
Neutro Abs: 7.5 K/uL (ref 1.7–7.7)
Neutrophils Relative %: 73 %
Platelets: 300 K/uL (ref 150–400)
RBC: 4.22 MIL/uL (ref 3.87–5.11)
RDW: 13.3 % (ref 11.5–15.5)
WBC: 10.3 K/uL (ref 4.0–10.5)
nRBC: 0 % (ref 0.0–0.2)

## 2023-12-21 SURGERY — CYSTOSCOPY, WITH RETROGRADE PYELOGRAM AND URETERAL STENT INSERTION
Anesthesia: General | Laterality: Right

## 2023-12-21 MED ORDER — FENTANYL CITRATE PF 50 MCG/ML IJ SOSY: 25.0000 ug | PREFILLED_SYRINGE | INTRAMUSCULAR | Status: DC | PRN

## 2023-12-21 MED ORDER — SODIUM CHLORIDE 0.9 % IV SOLN: INTRAVENOUS | Status: DC

## 2023-12-21 MED ORDER — FENTANYL CITRATE PF 50 MCG/ML IJ SOSY
PREFILLED_SYRINGE | INTRAMUSCULAR | Status: AC
  Filled 2023-12-21: qty 1

## 2023-12-21 MED ORDER — OXYCODONE HCL 5 MG PO TABS
5.0000 mg | ORAL_TABLET | Freq: Once | ORAL | Status: AC
  Administered 2023-12-21: 5 mg via ORAL
  Filled 2023-12-21: qty 1

## 2023-12-21 MED ORDER — STERILE WATER FOR IRRIGATION IR SOLN
Status: DC | PRN
  Administered 2023-12-21: 3000 mL

## 2023-12-21 MED ORDER — DEXAMETHASONE SODIUM PHOSPHATE 10 MG/ML IJ SOLN
INTRAMUSCULAR | Status: AC
Start: 2023-12-21 — End: 2023-12-21
  Filled 2023-12-21: qty 2

## 2023-12-21 MED ORDER — FENTANYL CITRATE (PF) 250 MCG/5ML IJ SOLN
INTRAMUSCULAR | Status: DC | PRN
  Administered 2023-12-21 (×4): 25 ug via INTRAVENOUS

## 2023-12-21 MED ORDER — ONDANSETRON HCL 4 MG/2ML IJ SOLN
INTRAMUSCULAR | Status: AC
Start: 2023-12-21 — End: 2023-12-21
  Filled 2023-12-21: qty 4

## 2023-12-21 MED ORDER — PROPOFOL 10 MG/ML IV BOLUS
INTRAVENOUS | Status: DC | PRN
Start: 2023-12-21 — End: 2023-12-21
  Administered 2023-12-21: 200 mg via INTRAVENOUS

## 2023-12-21 MED ORDER — FENTANYL CITRATE (PF) 100 MCG/2ML IJ SOLN
INTRAMUSCULAR | Status: AC
  Filled 2023-12-21: qty 2

## 2023-12-21 MED ORDER — IOHEXOL 300 MG/ML  SOLN
INTRAMUSCULAR | Status: DC | PRN
  Administered 2023-12-21: 16 mL

## 2023-12-21 MED ORDER — MIDAZOLAM HCL 2 MG/2ML IJ SOLN
INTRAMUSCULAR | Status: DC | PRN
  Administered 2023-12-21: 2 mg via INTRAVENOUS

## 2023-12-21 MED ORDER — ONDANSETRON HCL 4 MG/2ML IJ SOLN: 4.0000 mg | Freq: Once | INTRAMUSCULAR | Status: DC | PRN

## 2023-12-21 MED ORDER — OXYCODONE-ACETAMINOPHEN 5-325 MG PO TABS: 1.0000 | ORAL_TABLET | Freq: Four times a day (QID) | ORAL | 0 refills | Status: DC | PRN

## 2023-12-21 MED ORDER — OXYCODONE HCL 5 MG/5ML PO SOLN: 5.0000 mg | Freq: Once | ORAL | Status: DC | PRN

## 2023-12-21 MED ORDER — ONDANSETRON HCL 4 MG/2ML IJ SOLN
4.0000 mg | Freq: Once | INTRAMUSCULAR | Status: AC
  Administered 2023-12-21: 4 mg via INTRAVENOUS
  Filled 2023-12-21: qty 2

## 2023-12-21 MED ORDER — PROPOFOL 1000 MG/100ML IV EMUL
INTRAVENOUS | Status: AC
Start: 2023-12-21 — End: 2023-12-21
  Filled 2023-12-21: qty 100

## 2023-12-21 MED ORDER — ACETAMINOPHEN 10 MG/ML IV SOLN: 1000.0000 mg | Freq: Once | INTRAVENOUS | Status: DC | PRN

## 2023-12-21 MED ORDER — OXYCODONE HCL 5 MG PO TABS: 5.0000 mg | ORAL_TABLET | Freq: Once | ORAL | Status: DC | PRN

## 2023-12-21 MED ORDER — CIPROFLOXACIN HCL 500 MG PO TABS: 500.0000 mg | ORAL_TABLET | Freq: Every day | ORAL | 0 refills | Status: AC

## 2023-12-21 MED ORDER — CIPROFLOXACIN IN D5W 400 MG/200ML IV SOLN
400.0000 mg | Freq: Once | INTRAVENOUS | Status: AC
  Administered 2023-12-21: 400 mg via INTRAVENOUS
  Filled 2023-12-21: qty 200

## 2023-12-21 MED ORDER — SCOPOLAMINE 1 MG/3DAYS TD PT72
MEDICATED_PATCH | TRANSDERMAL | Status: AC
  Filled 2023-12-21: qty 1

## 2023-12-21 MED ORDER — KETOROLAC TROMETHAMINE 10 MG PO TABS: 10.0000 mg | ORAL_TABLET | Freq: Three times a day (TID) | ORAL | 0 refills | Status: AC | PRN

## 2023-12-21 MED ORDER — FENTANYL CITRATE PF 50 MCG/ML IJ SOSY
50.0000 ug | PREFILLED_SYRINGE | INTRAMUSCULAR | Status: DC | PRN
Start: 2023-12-21 — End: 2023-12-22
  Administered 2023-12-21: 50 ug via INTRAVENOUS

## 2023-12-21 MED ORDER — SENNOSIDES-DOCUSATE SODIUM 8.6-50 MG PO TABS: 1.0000 | ORAL_TABLET | Freq: Two times a day (BID) | ORAL | 0 refills | Status: AC

## 2023-12-21 MED ORDER — CHLORHEXIDINE GLUCONATE 0.12 % MT SOLN
15.0000 mL | Freq: Once | OROMUCOSAL | Status: AC
  Administered 2023-12-21: 15 mL via OROMUCOSAL

## 2023-12-21 MED ORDER — PROPOFOL 500 MG/50ML IV EMUL
INTRAVENOUS | Status: DC | PRN
  Administered 2023-12-21: 150 ug/kg/min via INTRAVENOUS

## 2023-12-21 MED ORDER — SUCCINYLCHOLINE CHLORIDE 200 MG/10ML IV SOSY
PREFILLED_SYRINGE | INTRAVENOUS | Status: AC
Start: 2023-12-21 — End: 2023-12-21
  Filled 2023-12-21: qty 10

## 2023-12-21 MED ORDER — SODIUM CHLORIDE 0.9 % IV BOLUS
500.0000 mL | Freq: Once | INTRAVENOUS | Status: AC
  Administered 2023-12-21: 500 mL via INTRAVENOUS

## 2023-12-21 MED ORDER — MIDAZOLAM HCL 2 MG/2ML IJ SOLN
INTRAMUSCULAR | Status: AC
  Filled 2023-12-21: qty 2

## 2023-12-21 MED ORDER — DEXAMETHASONE SODIUM PHOSPHATE 10 MG/ML IJ SOLN
INTRAMUSCULAR | Status: DC | PRN
  Administered 2023-12-21: 10 mg via INTRAVENOUS

## 2023-12-21 MED ORDER — PHENYLEPHRINE 80 MCG/ML (10ML) SYRINGE FOR IV PUSH (FOR BLOOD PRESSURE SUPPORT)
PREFILLED_SYRINGE | INTRAVENOUS | Status: DC | PRN
  Administered 2023-12-21: 80 ug via INTRAVENOUS

## 2023-12-21 MED ORDER — EPHEDRINE 5 MG/ML INJ
INTRAVENOUS | Status: AC
  Filled 2023-12-21: qty 5

## 2023-12-21 MED ORDER — SCOPOLAMINE 1 MG/3DAYS TD PT72
1.0000 | MEDICATED_PATCH | TRANSDERMAL | Status: DC
  Administered 2023-12-21: 1 mg via TRANSDERMAL

## 2023-12-21 MED ORDER — KETOROLAC TROMETHAMINE 15 MG/ML IJ SOLN
10.0000 mg | Freq: Once | INTRAMUSCULAR | Status: AC
  Administered 2023-12-21: 10 mg via INTRAVENOUS
  Filled 2023-12-21: qty 1

## 2023-12-21 MED ORDER — LIDOCAINE 2% (20 MG/ML) 5 ML SYRINGE
INTRAMUSCULAR | Status: DC | PRN
  Administered 2023-12-21: 100 mg via INTRAVENOUS

## 2023-12-21 MED ORDER — ONDANSETRON HCL 4 MG/2ML IJ SOLN
INTRAMUSCULAR | Status: DC | PRN
  Administered 2023-12-21: 4 mg via INTRAVENOUS

## 2023-12-21 MED ORDER — KETOROLAC TROMETHAMINE 15 MG/ML IJ SOLN
15.0000 mg | Freq: Once | INTRAMUSCULAR | Status: AC
  Administered 2023-12-21: 15 mg via INTRAVENOUS
  Filled 2023-12-21: qty 1

## 2023-12-21 MED ORDER — PHENYLEPHRINE 80 MCG/ML (10ML) SYRINGE FOR IV PUSH (FOR BLOOD PRESSURE SUPPORT)
PREFILLED_SYRINGE | INTRAVENOUS | Status: AC
Start: 2023-12-21 — End: 2023-12-21
  Filled 2023-12-21: qty 40

## 2023-12-21 SURGICAL SUPPLY — 16 items
BAG URO CATCHER STRL LF (MISCELLANEOUS) ×1 IMPLANT
BASKET ZERO TIP NITINOL 2.4FR (BASKET) IMPLANT
CATH URETL OPEN END 6FR 70 (CATHETERS) IMPLANT
CLOTH BEACON ORANGE TIMEOUT ST (SAFETY) ×1 IMPLANT
EXTRACTOR STONE NITINOL NGAGE (UROLOGICAL SUPPLIES) IMPLANT
FIBER LASER TRACTIP 200 (UROLOGICAL SUPPLIES) IMPLANT
GLOVE SURG LX STRL 7.5 STRW (GLOVE) ×1 IMPLANT
GOWN STRL REUS W/ TWL XL LVL3 (GOWN DISPOSABLE) ×1 IMPLANT
GUIDEWIRE ANG ZIPWIRE 038X150 (WIRE) ×1 IMPLANT
GUIDEWIRE STR DUAL SENSOR (WIRE) IMPLANT
KIT TURNOVER KIT A (KITS) ×1 IMPLANT
MANIFOLD NEPTUNE II (INSTRUMENTS) ×1 IMPLANT
PACK CYSTO (CUSTOM PROCEDURE TRAY) ×1 IMPLANT
STENT POLARIS 5FRX24 (STENTS) IMPLANT
TUBE PU 8FR 16IN ENFIT (TUBING) IMPLANT
TUBING CONNECTING 10 (TUBING) ×1 IMPLANT

## 2023-12-21 NOTE — Discharge Instructions (Addendum)
1 - You may have urinary urgency (bladder spasms) and bloody urine on / off with stent in place. This is normal.  2 - Remove tethered stent on Friday morning at home by pulling string, then blue-white plastic tubing, and discarding. Office is open Friday if any problems arise.   3 - Call MD or go to ER for fever >102, severe pain / nausea / vomiting not relieved by medications, or acute change in medical status  

## 2023-12-21 NOTE — ED Notes (Signed)
 Pt in [place] E, Dr Alvaro aware pt has arrived from [facility]. She will not be going to surgery till late tonight. Dr Randol aware.

## 2023-12-21 NOTE — Anesthesia Procedure Notes (Signed)
 Procedure Name: LMA Insertion Date/Time: 12/21/2023 7:58 PM  Performed by: Claudene Hoy CROME, CRNAPre-anesthesia Checklist: Patient identified, Emergency Drugs available, Suction available and Patient being monitored Patient Re-evaluated:Patient Re-evaluated prior to induction Oxygen Delivery Method: Circle System Utilized Preoxygenation: Pre-oxygenation with 100% oxygen Induction Type: IV induction Ventilation: Mask ventilation without difficulty LMA: LMA inserted LMA Size: 4.0 Number of attempts: 1 Placement Confirmation: positive ETCO2 Tube secured with: Tape Dental Injury: Teeth and Oropharynx as per pre-operative assessment

## 2023-12-21 NOTE — ED Provider Notes (Signed)
 [place] EMERGENCY DEPARTMENT AT [facility] Provider Note   CSN: [id number] Arrival date & time: 12/21/23  9164     Patient presents with: Flank Pain   Janice Ibarra is a 48 y.o. female.   HPI     48 year old patient comes in with chief complaint of persistent right sided pain. Patient was seen yesterday early in the morning and was diagnosed with kidney stone.  Patient states that she received a shot of Toradol  at 7:30 AM and again around 10:30 AM, with transient relief.  However, she has had severe nausea and vomiting, has been unable to keep anything down all night and the pain is worsening -prompting her to come to the ER.  Patient has been unable to tolerate pain meds.  Pain has moved towards the flank area.  She has had previous kidney stones and has required lithotripsy.  Prior to Admission medications   Medication Sig Start Date End Date Taking? Authorizing Provider  cyclobenzaprine (FLEXERIL) 10 MG tablet Take 1 tablet by mouth every evening. 08/28/19  Yes [provider]  ibuprofen (ADVIL,MOTRIN) 200 MG tablet Take 200 mg every 8 (eight) hours as needed by mouth for headache.   Yes [provider]  tamsulosin  (FLOMAX ) 0.4 MG CAPS capsule Take 1 capsule (0.4 mg total) by mouth daily. 12/20/23  Yes Delo, Vicenta, MD  oxyCODONE -acetaminophen  (PERCOCET) 5-325 MG tablet Take 1-2 tablets by mouth every 6 (six) hours as needed. Patient not taking: Reported on 12/21/2023 12/20/23   Geroldine Vicenta, MD    Allergies: Penicillins and Erythromycin    Review of Systems  All other systems reviewed and are negative.   Updated Vital Signs BP 129/79   Pulse 64   Temp 98.3 F (36.8 C) (Oral)   Resp 18   Ht 5' 5 (1.651 m)   Wt 74.8 kg   SpO2 99%   BMI 27.44 kg/m   Physical Exam Vitals and nursing note reviewed.  Constitutional:      General: She is in acute distress.     Appearance: She is well-developed.  HENT:     Head: Atraumatic.   Cardiovascular:     Rate and Rhythm: Normal rate.  Pulmonary:     Effort: Pulmonary effort is normal.  Abdominal:     Tenderness: There is no abdominal tenderness.  Musculoskeletal:     Cervical back: Normal range of motion and neck supple.  Skin:    General: Skin is warm and dry.  Neurological:     Mental Status: She is alert and oriented to person, place, and time.     (all labs ordered are listed, but only abnormal results are displayed) Labs Reviewed  BASIC METABOLIC PANEL WITH GFR - Abnormal; Notable for the following components:      Result Value   CO2 20 (*)    Glucose, Bld 115 (*)    BUN 21 (*)    Creatinine, Ser 1.34 (*)    GFR, Estimated 49 (*)    All other components within normal limits  CBC WITH DIFFERENTIAL/PLATELET - Abnormal; Notable for the following components:   Hemoglobin 11.8 (*)    All other components within normal limits  URINALYSIS, W/ REFLEX TO CULTURE (INFECTION SUSPECTED) - Abnormal; Notable for the following components:   Color, Urine AMBER (*)    APPearance CLOUDY (*)    Specific Gravity, Urine 1.032 (*)    Hgb urine dipstick MODERATE (*)    Protein, ur 30 (*)  Nitrite POSITIVE (*)    Bacteria, UA RARE (*)    All other components within normal limits  URINE CULTURE    EKG: None  Radiology: DG Abdomen 1 View Result Date: 12/21/2023 CLINICAL DATA:  Flank pain for 3 days. EXAM: ABDOMEN - 1 VIEW COMPARISON:  CT abdomen pelvis 12/20/2023. FINDINGS: No definite radiopaque calculi project over the renal outlines. There is a calcification in the expected location of the right ureterovesical junction. Surgical clips in the right upper quadrant. IMPRESSION: Probable stone in the distal right ureter. Electronically Signed   By: Newell Eke M.D.   On: 12/21/2023 11:11   US  Renal Result Date: 12/21/2023 CLINICAL DATA:  History of nephrolithiasis presenting with right-sided flank pain. EXAM: RENAL / URINARY TRACT ULTRASOUND COMPLETE COMPARISON:   None Available. FINDINGS: Right Kidney: Renal measurements: 12.4 cm x 6.3 cm x 6.2 cm = volume: 258.3 mL. Echogenicity within normal limits. No mass is visualized. There is moderate to marked severity right-sided hydronephrosis. Left Kidney: Renal measurements: 11.5 cm x 4.8 cm x 5.0 cm = volume: 148.8 mL. Echogenicity within normal limits. No mass or hydronephrosis visualized. Bladder: The urinary bladder is not clearly visualized. Other: None. IMPRESSION: Moderate to marked severity right-sided hydronephrosis. Electronically Signed   By: Suzen Dials M.D.   On: 12/21/2023 11:08   CT Renal Stone Study Result Date: 12/20/2023 CLINICAL DATA:  Right flank pain EXAM: CT ABDOMEN AND PELVIS WITHOUT CONTRAST TECHNIQUE: Multidetector CT imaging of the abdomen and pelvis was performed following the standard protocol without IV contrast. RADIATION DOSE REDUCTION: This exam was performed according to the departmental dose-optimization program which includes automated exposure control, adjustment of the mA and/or kV according to patient size and/or use of iterative reconstruction technique. COMPARISON:  None Available. FINDINGS: Lower chest: No acute findings Hepatobiliary: No focal liver abnormality is seen. Status post cholecystectomy. No biliary dilatation. Pancreas: No focal abnormality or ductal dilatation. Spleen: No focal abnormality.  Normal size. Adrenals/Urinary Tract: Adrenal glands normal. Moderate right hydroureteronephrosis due to 4 mm distal right ureteral stone. No stones or hydronephrosis on the left. Urinary bladder decompressed. Stomach/Bowel: Normal appendix. Stomach, large and small bowel grossly unremarkable. Vascular/Lymphatic: No evidence of aneurysm or adenopathy. Reproductive: Prior hysterectomy.  No adnexal masses. Other: No free fluid or free air. Musculoskeletal: No acute bony abnormality. IMPRESSION: 4 mm distal right ureteral stone with mild right hydroureteronephrosis. Normal appendix.  Electronically Signed   By: Franky Crease M.D.   On: 12/20/2023 01:27     Procedures   Medications Ordered in the ED  ciprofloxacin  (CIPRO ) IVPB 400 mg (400 mg Intravenous New Bag/Given 12/21/23 1326)  ketorolac  (TORADOL ) 15 MG/ML injection 10 mg (has no administration in time range)  ondansetron  (ZOFRAN ) injection 4 mg (has no administration in time range)  ketorolac  (TORADOL ) 15 MG/ML injection 15 mg (15 mg Intravenous Given 12/21/23 0909)  ondansetron  (ZOFRAN ) injection 4 mg (4 mg Intravenous Given 12/21/23 0909)  sodium chloride  0.9 % bolus 500 mL (0 mLs Intravenous Stopped 12/21/23 1328)  oxyCODONE  (Oxy IR/ROXICODONE ) immediate release tablet 5 mg (5 mg Oral Given 12/21/23 1123)    Clinical Course as of 12/21/23 1421  Tue Dec 21, 2023  1123 Urinalysis, w/ Reflex to Culture (Infection Suspected) -Urine, Clean Catch(!) Urine was nitrate positive yesterday, same today.  Oral challenge has been initiated.  I will give patient Cipro  as she is listed as anaphylactic to penicillin.  P.o. challenge initiated. [AN]  1420 Dr. Levander, Dr. Jackquline aware at [facility]  long.  Patient prefers being transferred to [facility].  She does not think she can continue to wait, as in the last 3 days she has been miserable.  Pain is still 8 out of 10.  Dr. Alvaro was made aware at [facility] long.  IV Cipro  given prior to transfer. [AN]    Clinical Course User Index [AN] Charlyn Sora, MD                                 Medical Decision Making Amount and/or Complexity of Data Reviewed Labs: ordered. Decision-making details documented in ED Course. Radiology: ordered.  Risk Prescription drug management.   48 year old patient comes in with chief complaint of persistent right-sided abdominal pain, with nausea and vomiting.  She is unable to tolerate p.o.  She was diagnosed with kidney stones yesterday at 3:30 AM.  Subsequently, at her work she received 2 dose of IM Toradol  to get her through the day.  However since  evening, she has had severe pain, nausea and vomiting.  Unable to tolerate p.o. including meds.  On exam, patient has no peritonitis.  She reports that her pain is right-sided, has moved up to the flank region and her urine output has gone down.  Plan is to get basic labs.  I have reviewed the CT scan from yesterday.  Repeat CT scan will not add significant value.  Distal ureter stone from CT noted.  I will order KUB to see if we can visualize a stone.  I will get ultrasound to see if there is any worsening of hydronephrosis.  Labs including UA and BMP ordered to look at renal function.  Pain control with Toradol .  Final diagnoses:  Ureteral colic  Acute UTI    ED Discharge Orders     None          Charlyn Sora, MD 12/21/23 1421

## 2023-12-21 NOTE — Consult Note (Signed)
 Urology Consult Note   Requesting Attending Physician:  Alvaro Ricardo KATHEE Mickey., MD Service Providing Consult: Urology  Consulting Attending: Dr. Alvaro   Reason for Consult:  renal colic, ureteral stone  HPI: Janice Ibarra is seen in consultation for reasons noted above at the request of Alvaro Ricardo KATHEE Mickey., MD. Patient is a 48 year old female with Hx of nephrolithiasis and GERD.  She initially presented to [facility] on 12/20/2023 with N/V, hematuria, and flank pain.  She was ultimately discharged on Flomax  and with analgesics.  She presented back to [facility] emergency department today, on 12/21/2023, with intractable nausea and vomiting and worsening pain.  She was ultimately transferred to [facility] emergency department for cystoscopy with laser lithotripsy and ureteral stent placement with Dr. Alvaro. At her and her husband in the hall.  Her pain was under control.  We reviewed the case and plan and all questions were answered to their satisfaction. ------------------  Assessment:   48 y.o. female with intractable renal colic   Recommendations:  #right ureteral stone Urinalysis nitrite positive, but amber in color.  Specific gravity consistent with significant dehydration.  11-20 squamous epithelial cells noted, contaminated sample.  4 mm distal right ureteral stone.  Intractable nausea/vomiting and renal colic.  To the OR for cystoscopy with right ureteroscopy and laser lithotripsy/stent placement.  Case and plan discussed with Dr. Alvaro, pt, spouse  Past Medical History: Past Medical History:  Diagnosis Date   Headache    History of kidney stones    Lymphedema    PONV (postoperative nausea and vomiting)     Past Surgical History:  Past Surgical History:  Procedure Laterality Date   ABDOMINAL HYSTERECTOMY     CHOLECYSTECTOMY     COLONOSCOPY N/A 02/19/2017   Procedure: COLONOSCOPY;  Surgeon: Golda Claudis PENNER, MD;  [facility];  Service: Endoscopy;   Laterality: N/A;  7:30   ESCHAROTOMY     ESOPHAGEAL DILATION N/A 02/19/2017   Procedure: ESOPHAGEAL DILATION;  Surgeon: Golda Claudis PENNER, MD;  [facility];  Service: Endoscopy;  Laterality: N/A;   ESOPHAGOGASTRODUODENOSCOPY N/A 02/19/2017   Procedure: ESOPHAGOGASTRODUODENOSCOPY (EGD);  Surgeon: Golda Claudis PENNER, MD;  [facility]: [facility];  Service: Endoscopy;  Laterality: N/A;   SKIN GRAFT Right    leg and arm    Medication: Current Facility-Administered Medications  Medication Dose Route Frequency Provider Last Rate Last Admin   0.9 %  sodium chloride  infusion   Intravenous Continuous Manny, Theodore B Jr., MD       Current Outpatient Medications  Medication Sig Dispense Refill   cyclobenzaprine (FLEXERIL) 10 MG tablet Take 1 tablet by mouth every evening.     ibuprofen (ADVIL,MOTRIN) 200 MG tablet Take 200 mg every 8 (eight) hours as needed by mouth for headache.     tamsulosin  (FLOMAX ) 0.4 MG CAPS capsule Take 1 capsule (0.4 mg total) by mouth daily. 10 capsule 0   oxyCODONE -acetaminophen  (PERCOCET) 5-325 MG tablet Take 1-2 tablets by mouth every 6 (six) hours as needed. (Patient not taking: Reported on 12/21/2023) 20 tablet 0    Allergies: Allergies  Allergen Reactions   Penicillins Anaphylaxis   Erythromycin Other (See Comments)    Pt states this was a childhood allergy but has taken other MYCIN drugs since    Social History: Social History   Tobacco Use   Smoking status: Former   Smokeless tobacco: Never   Tobacco comments:    quit 2002  Vaping Use  Vaping status: Never Used  Substance Use Topics   Alcohol use: No   Drug use: No    Family History Family History  Problem Relation Age of Onset   Diabetes Mother     Review of Systems  Genitourinary:  Positive for flank pain. Negative for dysuria, frequency, hematuria and urgency.     Objective   Vital signs in last 24 hours: BP (!) 97/56   Pulse 75   Temp 99.1 F (37.3 C) (Oral)   Resp 18    Ht 5' 5 (1.651 m)   Wt 74.8 kg   SpO2 99%   BMI 27.44 kg/m   Physical Exam General: A&O, resting, appropriate HEENT: [place]/AT Pulmonary: Normal work of breathing Cardiovascular: no cyanosis Abdomen: Soft, NTTP, nondistended Neuro: Appropriate, no focal neurological deficits  Most Recent Labs: Lab Results  Component Value Date   WBC 10.3 12/21/2023   HGB 11.8 (L) 12/21/2023   HCT 36.2 12/21/2023   PLT 300 12/21/2023    Lab Results  Component Value Date   NA 140 12/21/2023   K 3.6 12/21/2023   CL 107 12/21/2023   CO2 20 (L) 12/21/2023   BUN 21 (H) 12/21/2023   CREATININE 1.34 (H) 12/21/2023   CALCIUM 9.2 12/21/2023    No results found for: INR, APTT   Urine Culture: @[id number] (laburin,org,[id number])@   IMAGING: DG Abdomen 1 View Result Date: 12/21/2023 CLINICAL DATA:  Flank pain for 3 days. EXAM: ABDOMEN - 1 VIEW COMPARISON:  CT abdomen pelvis 12/20/2023. FINDINGS: No definite radiopaque calculi project over the renal outlines. There is a calcification in the expected location of the right ureterovesical junction. Surgical clips in the right upper quadrant. IMPRESSION: Probable stone in the distal right ureter. Electronically Signed   By: Newell Eke M.D.   On: 12/21/2023 11:11   US  Renal Result Date: 12/21/2023 CLINICAL DATA:  History of nephrolithiasis presenting with right-sided flank pain. EXAM: RENAL / URINARY TRACT ULTRASOUND COMPLETE COMPARISON:  None Available. FINDINGS: Right Kidney: Renal measurements: 12.4 cm x 6.3 cm x 6.2 cm = volume: 258.3 mL. Echogenicity within normal limits. No mass is visualized. There is moderate to marked severity right-sided hydronephrosis. Left Kidney: Renal measurements: 11.5 cm x 4.8 cm x 5.0 cm = volume: 148.8 mL. Echogenicity within normal limits. No mass or hydronephrosis visualized. Bladder: The urinary bladder is not clearly visualized. Other: None. IMPRESSION: Moderate to marked severity right-sided hydronephrosis.  Electronically Signed   By: Suzen Dials M.D.   On: 12/21/2023 11:08   CT Renal Stone Study Result Date: 12/20/2023 CLINICAL DATA:  Right flank pain EXAM: CT ABDOMEN AND PELVIS WITHOUT CONTRAST TECHNIQUE: Multidetector CT imaging of the abdomen and pelvis was performed following the standard protocol without IV contrast. RADIATION DOSE REDUCTION: This exam was performed according to the departmental dose-optimization program which includes automated exposure control, adjustment of the mA and/or kV according to patient size and/or use of iterative reconstruction technique. COMPARISON:  None Available. FINDINGS: Lower chest: No acute findings Hepatobiliary: No focal liver abnormality is seen. Status post cholecystectomy. No biliary dilatation. Pancreas: No focal abnormality or ductal dilatation. Spleen: No focal abnormality.  Normal size. Adrenals/Urinary Tract: Adrenal glands normal. Moderate right hydroureteronephrosis due to 4 mm distal right ureteral stone. No stones or hydronephrosis on the left. Urinary bladder decompressed. Stomach/Bowel: Normal appendix. Stomach, large and small bowel grossly unremarkable. Vascular/Lymphatic: No evidence of aneurysm or adenopathy. Reproductive: Prior hysterectomy.  No adnexal masses. Other: No free fluid or free air. Musculoskeletal: No  acute bony abnormality. IMPRESSION: 4 mm distal right ureteral stone with mild right hydroureteronephrosis. Normal appendix. Electronically Signed   By: Franky Crease M.D.   On: 12/20/2023 01:27    ------  Ole Bourdon, NP Pager: [phone]   Please contact the urology consult pager with any further questions/concerns.

## 2023-12-21 NOTE — Brief Op Note (Signed)
 12/21/2023  8:30 PM  PATIENT:  Macario RAMAN Morson  48 y.o. female  PRE-OPERATIVE DIAGNOSIS:  RIGHT URETERAL STONE  POST-OPERATIVE DIAGNOSIS:  RIGHT URETERAL STONE  PROCEDURE:  Procedure(s): CYSTOSCOPY, WITH RETROGRADE PYELOGRAM AND URETERAL STENT INSERTION (Right)  SURGEON:  Surgeons and Role:    * Manny, Ricardo KATHEE Raddle., MD - Primary  PHYSICIAN ASSISTANT:   ASSISTANTS: none   ANESTHESIA:   general  EBL:  minimal   BLOOD ADMINISTERED:none  DRAINS: none   LOCAL MEDICATIONS USED:  NONE  SPECIMEN:  Source of Specimen:  Rt ureteral stone fragemnts  DISPOSITION OF SPECIMEN:  [facility] for compositional analysis  COUNTS:  YES  TOURNIQUET:  * No tourniquets in log *  DICTATION: .Other Dictation: Dictation Number [id number]  PLAN OF CARE: Discharge to home after PACU  PATIENT DISPOSITION:  PACU - hemodynamically stable.   Delay start of Pharmacological VTE agent (>24hrs) due to surgical blood loss or risk of bleeding: not applicable

## 2023-12-21 NOTE — Anesthesia Preprocedure Evaluation (Addendum)
 Anesthesia Evaluation  Patient identified by MRN, date of birth, ID band Patient awake    Reviewed: Allergy & Precautions, NPO status , Patient's Chart, lab work & pertinent test results, reviewed documented beta blocker date and time   History of Anesthesia Complications (+) PONV and history of anesthetic complications  Airway Mallampati: III  TM Distance: >3 FB Neck ROM: Full  Mouth opening: Limited Mouth Opening  Dental no notable dental hx.    Pulmonary neg COPD, former smoker   breath sounds clear to auscultation       Cardiovascular (-) angina (-) CAD and (-) Past MI  Rhythm:Regular Rate:Normal     Neuro/Psych  Headaches, neg Seizures    GI/Hepatic ,GERD  Medicated and Controlled,,(+) neg Cirrhosis        Endo/Other    Renal/GU Renal disease     Musculoskeletal   Abdominal   Peds  Hematology   Anesthesia Other Findings   Reproductive/Obstetrics                              Anesthesia Physical Anesthesia Plan  ASA: 2  Anesthesia Plan: General   Post-op Pain Management:    Induction: Intravenous  PONV Risk Score and Plan: 2 and Ondansetron , Dexamethasone , TIVA, Propofol  infusion and Scopolamine  patch - Pre-op  Airway Management Planned: LMA  Additional Equipment:   Intra-op Plan:   Post-operative Plan: Extubation in OR  Informed Consent: I have reviewed the patients History and Physical, chart, labs and discussed the procedure including the risks, benefits and alternatives for the proposed anesthesia with the patient or authorized representative who has indicated his/her understanding and acceptance.     Dental advisory given  Plan Discussed with: CRNA  Anesthesia Plan Comments:          Anesthesia Quick Evaluation

## 2023-12-21 NOTE — Op Note (Unsigned)
 NAME: Janice Ibarra, CONDE MEDICAL RECORD NO: [id number] ACCOUNT NO: [id number] DATE OF BIRTH: November 14, 1975 FACILITY: [facility] LOCATION: [facility] PHYSICIAN: Ricardo Likens, MD  Operative Report   DATE OF PROCEDURE: 12/21/2023  SURGEON: Ricardo Likens, MD  PREOPERATIVE DIAGNOSIS: Right ureteral stone with refractory colic.  PROCEDURE PERFORMED: 1.  Cystoscopy with right retrograde pyelogram with interpretation. 2.  Right ureteroscopy with laser lithotripsy. 3.  Insertion of right ureteral stent.   ESTIMATED BLOOD LOSS:  Nil.    COMPLICATIONS:  None.   SPECIMENS:  Right ureteral stone fragments for composition analysis.  FINDINGS:  1.  Moderate to severe hydronephrosis due to a distal ureteral stone. 2.  Complete resolution of all accessible stone fragments larger than one third mm following laser lithotripsy and basket extraction. 3.  Successful placement of left ureteral stent, proximal end in renal pelvis, distal end in the urinary bladder with tether.  INDICATIONS:  The patient is a pleasant 48 year old lady who was found on workup of flank pain to have a significant right distal ureteral stone.  She was given a brief fairly appropriate trial of medical therapy but failed to pass the stone and her  colic remained severe, requiring IM and IV medications to control, only poorly.  I discussed her case with the on-call physician [facility] earlier today.  I recommended transfer to [facility] today.  She presented for this, and informed consent was obtained and placed  in the med record.  DESCRIPTION OF PROCEDURE:  The patient being Janice Ibarra was identified and the procedure being right ureteroscopic stone manipulation was confirmed.  Procedure time out was performed.  Intravenous antibiotics administered.  General LMA anesthesia was  induced.  The patient was placed into a low lithotomy position.  Sterile fields were created prepped and draped the patient's vagina, introitus and  proximal thighs using iodine.  Cystourethroscopy was performed using a 21-French with rigid cystoscope  with offset lens.  Inspection of the urinary bladder revealed no diverticula, calcifications, or papillary lesions.   The right ureteral orifice was cannulated with a 6-French open-ended catheter and a right retrograde pyelogram was obtained.   The right retrograde pyelogram demonstrated a single right ureter that was quite tortuous and dilated to a distal filling defect consistent with the known stone.   A 0.038 zipwire was advanced at the level of the upper pole and set aside as a safety wire.  An 8-French feeding tube was placed in the urinary bladder for pressure release.  A semi-rigid ureteroscopy was performed in the distal right ureter alongside  the separate sensory working wire. Just above the intramural ureter, the stone in question was encountered.  It was somewhat fusiform but still too large for distal basketing.  Holmium laser energy was then applied to the stone at a setting of 0.2 joules  and 20 Hz.  The stone was fragmented into smaller pieces that were then amenable to basketing and set aside for composition analysis.   Following this, there was complete resolution of all accessible stone fragments in the ureter larger than one third mm.  Given her significant hydronephrosis, it was felt that brief interval stenting with a tether stent would be most prudent and a new 5  x 26 Polaris type stent was carefully placed using cystoscopic and fluoroscopic guidance.  Good proximal and distal planes were noted.  The tether was left in place, trimmed to length and tucked per vagina.  Procedure was terminated.  The patient  tolerated the procedure well.  No immediate peri procedure complications.  The patient was taken to postanesthesia care unit in stable condition.  Plan for discharge home.    MUK D: 12/21/2023 8:34:22 pm T: 12/21/2023 10:41:00 pm  JOB: [id number]/ [id number]

## 2023-12-21 NOTE — ED Triage Notes (Signed)
 Pt arrived POV to ED with complaints of flank pain x3 days. Pt was seen in ED on Sunday for same problem and was told that she has a kidney stone in the R kidney that's 4mm. Pt states she's been running a fever. Pt states that she now unable to void. Pt rates pain 8/10

## 2023-12-21 NOTE — H&P (Signed)
 Janice Ibarra is an 48 y.o. female.    Chief Complaint: PRe-Op RIGHT Ureteroscopic Stone Manipulation  HPI:   1 - RIGHT Ureteral Stone - 4mm Rt distal stone on ER ct on eval flank pain. Cr 1.3, no leukocytosis. Given trial of medical therapy but colic refracotry. No bacteria on UA.   PMH sig for lap chole, benign hyst.  Today Lakea is seen to proceed with RIGHT ureteroscopy for distal stone for refracotry colic. No fevers.   Past Medical History:  Diagnosis Date   Headache    History of kidney stones    Lymphedema    PONV (postoperative nausea and vomiting)     Past Surgical History:  Procedure Laterality Date   ABDOMINAL HYSTERECTOMY     CHOLECYSTECTOMY     COLONOSCOPY N/A 02/19/2017   Procedure: COLONOSCOPY;  Surgeon: Golda Claudis PENNER, MD;  [facility];  Service: Endoscopy;  Laterality: N/A;  7:30   ESCHAROTOMY     ESOPHAGEAL DILATION N/A 02/19/2017   Procedure: ESOPHAGEAL DILATION;  Surgeon: Golda Claudis PENNER, MD;  [facility];  Service: Endoscopy;  Laterality: N/A;   ESOPHAGOGASTRODUODENOSCOPY N/A 02/19/2017   Procedure: ESOPHAGOGASTRODUODENOSCOPY (EGD);  Surgeon: Golda Claudis PENNER, MD;  [facility];  Service: Endoscopy;  Laterality: N/A;   SKIN GRAFT Right    leg and arm    Family History  Problem Relation Age of Onset   Diabetes Mother    Social History:  reports that she has quit smoking. She has never used smokeless tobacco. She reports that she does not drink alcohol and does not use drugs.  Allergies:  Allergies  Allergen Reactions   Penicillins Anaphylaxis   Erythromycin Other (See Comments)    Pt states this was a childhood allergy but has taken other MYCIN drugs since    Medications Prior to Admission  Medication Sig Dispense Refill   cyclobenzaprine (FLEXERIL) 10 MG tablet Take 1 tablet by mouth every evening.     ibuprofen (ADVIL,MOTRIN) 200 MG tablet Take 200 mg every 8 (eight) hours as needed by mouth for headache.      tamsulosin  (FLOMAX ) 0.4 MG CAPS capsule Take 1 capsule (0.4 mg total) by mouth daily. 10 capsule 0   oxyCODONE -acetaminophen  (PERCOCET) 5-325 MG tablet Take 1-2 tablets by mouth every 6 (six) hours as needed. (Patient not taking: Reported on 12/21/2023) 20 tablet 0    Results for orders placed or performed during the hospital encounter of 12/21/23 (from the past 48 hours)  Basic metabolic panel     Status: Abnormal   Collection Time: 12/21/23  9:01 AM  Result Value Ref Range   Sodium 140 135 - 145 mmol/L   Potassium 3.6 3.5 - 5.1 mmol/L   Chloride 107 98 - 111 mmol/L   CO2 20 (L) 22 - 32 mmol/L   Glucose, Bld 115 (H) 70 - 99 mg/dL    Comment: Glucose reference range applies only to samples taken after fasting for at least 8 hours.   BUN 21 (H) 6 - 20 mg/dL   Creatinine, Ser 8.65 (H) 0.44 - 1.00 mg/dL   Calcium 9.2 8.9 - 89.6 mg/dL   GFR, Estimated 49 (L) >60 mL/min    Comment: (NOTE) Calculated using the CKD-EPI Creatinine Equation (2021)    Anion gap 13 5 - 15    Comment: Performed at [facility], [address] [place], [place] [address]  CBC with Differential     Status: Abnormal   Collection  Time: 12/21/23  9:01 AM  Result Value Ref Range   WBC 10.3 4.0 - 10.5 K/uL   RBC 4.22 3.87 - 5.11 MIL/uL   Hemoglobin 11.8 (L) 12.0 - 15.0 g/dL   HCT 63.7 63.9 - 53.9 %   MCV 85.8 80.0 - 100.0 fL   MCH 28.0 26.0 - 34.0 pg   MCHC 32.6 30.0 - 36.0 g/dL   RDW 86.6 88.4 - 84.4 %   Platelets 300 150 - 400 K/uL   nRBC 0.0 0.0 - 0.2 %   Neutrophils Relative % 73 %   Neutro Abs 7.5 1.7 - 7.7 K/uL   Lymphocytes Relative 18 %   Lymphs Abs 1.8 0.7 - 4.0 K/uL   Monocytes Relative 8 %   Monocytes Absolute 0.8 0.1 - 1.0 K/uL   Eosinophils Relative 1 %   Eosinophils Absolute 0.1 0.0 - 0.5 K/uL   Basophils Relative 0 %   Basophils Absolute 0.0 0.0 - 0.1 K/uL   Immature Granulocytes 0 %   Abs Immature Granulocytes 0.03 0.00 - 0.07 K/uL    Comment: Performed at [facility], [address], [place], [place] [address]  Urinalysis, w/ Reflex to Culture (Infection Suspected) -Urine, Clean Catch     Status: Abnormal   Collection Time: 12/21/23 10:23 AM  Result Value Ref Range   Specimen Source URINE, CLEAN CATCH    Color, Urine AMBER (A) YELLOW    Comment: BIOCHEMICALS MAY BE AFFECTED BY COLOR   APPearance CLOUDY (A) CLEAR   Specific Gravity, Urine 1.032 (H) 1.005 - 1.030   pH 5.0 5.0 - 8.0   Glucose, UA NEGATIVE NEGATIVE mg/dL   Hgb urine dipstick MODERATE (A) NEGATIVE   Bilirubin Urine NEGATIVE NEGATIVE   Ketones, ur NEGATIVE NEGATIVE mg/dL   Protein, ur 30 (A) NEGATIVE mg/dL   Nitrite POSITIVE (A) NEGATIVE   Leukocytes,Ua NEGATIVE NEGATIVE   RBC / HPF >50 0 - 5 RBC/hpf   WBC, UA 0-5 0 - 5 WBC/hpf    Comment:        Reflex urine culture not performed if WBC <=10, OR if Squamous epithelial cells >5. If Squamous epithelial cells >5 suggest recollection.    Bacteria, UA RARE (A) NONE SEEN   Squamous Epithelial / HPF 11-20 0 - 5 /HPF   Mucus PRESENT     Comment: Performed at [facility], [address], [place], [place] [address]   DG Abdomen 1 View Result Date: 12/21/2023 CLINICAL DATA:  Flank pain for 3 days. EXAM: ABDOMEN - 1 VIEW COMPARISON:  CT abdomen pelvis 12/20/2023. FINDINGS: No definite radiopaque calculi project over the renal outlines. There is a calcification in the expected location of the right ureterovesical junction. Surgical clips in the right upper quadrant. IMPRESSION: Probable stone in the distal right ureter. Electronically Signed   By: Newell Eke M.D.   On: 12/21/2023 11:11   US  Renal Result Date: 12/21/2023 CLINICAL DATA:  History of nephrolithiasis presenting with right-sided flank pain. EXAM: RENAL / URINARY TRACT ULTRASOUND COMPLETE COMPARISON:  None Available. FINDINGS: Right Kidney: Renal measurements: 12.4 cm x 6.3 cm x 6.2 cm = volume: 258.3 mL. Echogenicity within normal limits. No mass is visualized. There is moderate to marked severity  right-sided hydronephrosis. Left Kidney: Renal measurements: 11.5 cm x 4.8 cm x 5.0 cm = volume: 148.8 mL. Echogenicity within normal limits. No mass or hydronephrosis visualized. Bladder: The urinary bladder is not clearly visualized. Other: None. IMPRESSION: Moderate to marked severity right-sided hydronephrosis. Electronically  Signed   By: Suzen Dials M.D.   On: 12/21/2023 11:08   CT Renal Stone Study Result Date: 12/20/2023 CLINICAL DATA:  Right flank pain EXAM: CT ABDOMEN AND PELVIS WITHOUT CONTRAST TECHNIQUE: Multidetector CT imaging of the abdomen and pelvis was performed following the standard protocol without IV contrast. RADIATION DOSE REDUCTION: This exam was performed according to the departmental dose-optimization program which includes automated exposure control, adjustment of the mA and/or kV according to patient size and/or use of iterative reconstruction technique. COMPARISON:  None Available. FINDINGS: Lower chest: No acute findings Hepatobiliary: No focal liver abnormality is seen. Status post cholecystectomy. No biliary dilatation. Pancreas: No focal abnormality or ductal dilatation. Spleen: No focal abnormality.  Normal size. Adrenals/Urinary Tract: Adrenal glands normal. Moderate right hydroureteronephrosis due to 4 mm distal right ureteral stone. No stones or hydronephrosis on the left. Urinary bladder decompressed. Stomach/Bowel: Normal appendix. Stomach, large and small bowel grossly unremarkable. Vascular/Lymphatic: No evidence of aneurysm or adenopathy. Reproductive: Prior hysterectomy.  No adnexal masses. Other: No free fluid or free air. Musculoskeletal: No acute bony abnormality. IMPRESSION: 4 mm distal right ureteral stone with mild right hydroureteronephrosis. Normal appendix. Electronically Signed   By: Franky Crease M.D.   On: 12/20/2023 01:27    Review of Systems  Constitutional:  Negative for chills and fever.  Gastrointestinal:  Positive for nausea.  Genitourinary:   Positive for flank pain.  All other systems reviewed and are negative.   Blood pressure 133/72, pulse 68, temperature 98.4 F (36.9 C), temperature source Oral, resp. rate 16, height 5' 5 (1.651 m), weight 74.8 kg, SpO2 98%. Physical Exam Vitals reviewed.  HENT:     Head: Normocephalic.  Eyes:     Pupils: Pupils are equal, round, and reactive to light.  Cardiovascular:     Rate and Rhythm: Normal rate.  Abdominal:     Comments: Very mild truncal obesity  Genitourinary:    Comments: Moderate Rt CVAT Musculoskeletal:        General: Normal range of motion.     Cervical back: Normal range of motion.  Skin:    General: Skin is warm.  Neurological:     General: No focal deficit present.  Psychiatric:        Mood and Affect: Mood normal.      Assessment/Plan  Proceed as planned with RIGHT ureteroscopic stone manipulation. Risks, benefits, alternaitves, peri-op course discussed.   Ricardo KATHEE Alvaro Mickey., MD 12/21/2023, 7:41 PM

## 2023-12-21 NOTE — Transfer of Care (Signed)
 Immediate Anesthesia Transfer of Care Note  Patient: Janice Ibarra  Procedure(s) Performed: CYSTOSCOPY, WITH RETROGRADE PYELOGRAM AND URETERAL STENT INSERTION (Right)  Patient Location: PACU  Anesthesia Type:General  Level of Consciousness: drowsy and patient cooperative  Airway & Oxygen Therapy: Patient Spontanous Breathing  Post-op Assessment: Report given to RN and Post -op Vital signs reviewed and stable  Post vital signs: Reviewed and stable  Last Vitals:  Vitals Value Taken Time  BP 123/72   Temp    Pulse 99 12/21/23 20:36  Resp    SpO2 95 % 12/21/23 20:36  Vitals shown include unfiled device data.  Last Pain:  Vitals:   12/21/23 1934  TempSrc:   PainSc: 6       Patients Stated Pain Goal: 0 (12/21/23 1934)  Complications: No notable events documented.

## 2023-12-22 ENCOUNTER — Encounter (HOSPITAL_COMMUNITY): Payer: Self-pay | Admitting: Urology

## 2023-12-22 ENCOUNTER — Telehealth: Payer: Self-pay | Admitting: Urology

## 2023-12-22 ENCOUNTER — Ambulatory Visit: Admitting: Urology

## 2023-12-22 LAB — URINE CULTURE: Culture: NO GROWTH

## 2023-12-22 NOTE — Telephone Encounter (Signed)
 Apt on 12/22/23 pt canceled due to being admitted on 12/21/23 at [facility] stent was inserted by Dr Patrcia and  kidney stone came out.

## 2023-12-23 NOTE — Anesthesia Postprocedure Evaluation (Signed)
 Anesthesia Post Note  Patient: Akilah Cureton Baskins  Procedure(s) Performed: CYSTOSCOPY, WITH RETROGRADE PYELOGRAM AND URETERAL STENT INSERTION (Right)     Patient location during evaluation: PACU Anesthesia Type: General Level of consciousness: awake and alert Pain management: pain level controlled Vital Signs Assessment: post-procedure vital signs reviewed and stable Respiratory status: spontaneous breathing, nonlabored ventilation, respiratory function stable and patient connected to nasal cannula oxygen Cardiovascular status: blood pressure returned to baseline and stable Postop Assessment: no apparent nausea or vomiting Anesthetic complications: no   No notable events documented.  Last Vitals:  Vitals:   12/21/23 2100 12/21/23 2115  BP: 109/72 113/71  Pulse: 67 77  Resp: 12   Temp:    SpO2: 98% 97%    Last Pain:  Vitals:   12/21/23 2115  TempSrc:   PainSc: 0-No pain                 Lynwood MARLA Cornea

## 2023-12-24 ENCOUNTER — Emergency Department (HOSPITAL_COMMUNITY)
Admission: EM | Admit: 2023-12-24 | Discharge: 2023-12-25 | Disposition: A | Discharge status: Home / Self Care | Attending: Emergency Medicine | Admitting: Emergency Medicine

## 2023-12-24 ENCOUNTER — Encounter (HOSPITAL_COMMUNITY): Payer: Self-pay

## 2023-12-24 ENCOUNTER — Emergency Department (HOSPITAL_COMMUNITY)

## 2023-12-24 DIAGNOSIS — D72829 Elevated white blood cell count, unspecified: Secondary | ICD-10-CM | POA: Diagnosis not present

## 2023-12-24 DIAGNOSIS — R109 Unspecified abdominal pain: Secondary | ICD-10-CM | POA: Diagnosis present

## 2023-12-24 DIAGNOSIS — N23 Unspecified renal colic: Secondary | ICD-10-CM | POA: Insufficient documentation

## 2023-12-24 MED ORDER — HYDROMORPHONE HCL 1 MG/ML IJ SOLN
1.0000 mg | Freq: Once | INTRAMUSCULAR | Status: AC
  Administered 2023-12-25: 1 mg via INTRAVENOUS
  Filled 2023-12-24: qty 1

## 2023-12-24 MED ORDER — ONDANSETRON HCL 4 MG/2ML IJ SOLN
4.0000 mg | Freq: Once | INTRAMUSCULAR | Status: AC
Start: 2023-12-24 — End: 2023-12-25
  Administered 2023-12-25: 4 mg via INTRAVENOUS
  Filled 2023-12-24: qty 2

## 2023-12-24 NOTE — ED Triage Notes (Addendum)
 Pt comes in kidney stone complications.  Pt was dx with kidney stone (right side) 9/9. Pt had surgery at [facility] long for Ureteral colic.   Pt removed her stent today. After pt removed stent and ate something she has become SOB and right flank pain. Pt called PCP about s/s and told her to drink water  and cranberry juice due to spasming. Pt has tried that multiple time with no relief. Pt tried at home medication that was given to her at d/c and they did not help her pain nor nausea.   Pt is able to pee

## 2023-12-25 ENCOUNTER — Emergency Department (HOSPITAL_COMMUNITY)

## 2023-12-25 LAB — URINALYSIS, ROUTINE W REFLEX MICROSCOPIC
Bacteria, UA: NONE SEEN
Bilirubin Urine: NEGATIVE
Glucose, UA: NEGATIVE mg/dL
Ketones, ur: NEGATIVE mg/dL
Leukocytes,Ua: NEGATIVE
Nitrite: NEGATIVE
Protein, ur: NEGATIVE mg/dL
RBC / HPF: >50 RBC/hpf (ref 0–5)
Specific Gravity, Urine: 1.019 (ref 1.005–1.030)
pH: 8 (ref 5.0–8.0)

## 2023-12-25 LAB — CBC WITH DIFFERENTIAL/PLATELET
Abs Immature Granulocytes: 0.04 K/uL (ref 0.00–0.07)
Basophils Absolute: 0.1 K/uL (ref 0.0–0.1)
Basophils Relative: 0 %
Eosinophils Absolute: 0.1 K/uL (ref 0.0–0.5)
Eosinophils Relative: 1 %
HCT: 32.7 % — ABNORMAL LOW (ref 36.0–46.0)
Hemoglobin: 10.9 g/dL — ABNORMAL LOW (ref 12.0–15.0)
Immature Granulocytes: 0 %
Lymphocytes Relative: 14 %
Lymphs Abs: 1.7 K/uL (ref 0.7–4.0)
MCH: 27.9 pg (ref 26.0–34.0)
MCHC: 33.3 g/dL (ref 30.0–36.0)
MCV: 83.6 fL (ref 80.0–100.0)
Monocytes Absolute: 1 K/uL (ref 0.1–1.0)
Monocytes Relative: 8 %
Neutro Abs: 9 K/uL — ABNORMAL HIGH (ref 1.7–7.7)
Neutrophils Relative %: 77 %
Platelets: 341 K/uL (ref 150–400)
RBC: 3.91 MIL/uL (ref 3.87–5.11)
RDW: 13.1 % (ref 11.5–15.5)
WBC: 11.8 K/uL — ABNORMAL HIGH (ref 4.0–10.5)
nRBC: 0 % (ref 0.0–0.2)

## 2023-12-25 LAB — BASIC METABOLIC PANEL WITH GFR
Anion gap: 12 (ref 5–15)
BUN: 20 mg/dL (ref 6–20)
CO2: 21 mmol/L — ABNORMAL LOW (ref 22–32)
Calcium: 9.3 mg/dL (ref 8.9–10.3)
Chloride: 104 mmol/L (ref 98–111)
Creatinine, Ser: 1.16 mg/dL — ABNORMAL HIGH (ref 0.44–1.00)
GFR, Estimated: 58 mL/min — ABNORMAL LOW (ref >60)
Glucose, Bld: 110 mg/dL — ABNORMAL HIGH (ref 70–99)
Potassium: 3.7 mmol/L (ref 3.5–5.1)
Sodium: 137 mmol/L (ref 135–145)

## 2023-12-25 MED ORDER — OXYCODONE-ACETAMINOPHEN 5-325 MG PO TABS: 1.0000 | ORAL_TABLET | Freq: Four times a day (QID) | ORAL | 0 refills | Status: AC | PRN

## 2023-12-25 MED ORDER — OXYCODONE-ACETAMINOPHEN 5-325 MG PO TABS: 1.0000 | ORAL_TABLET | ORAL | 0 refills | Status: AC | PRN

## 2023-12-25 MED ORDER — ONDANSETRON 4 MG PO TBDP: ORAL_TABLET | ORAL | 0 refills | Status: AC

## 2023-12-25 MED ORDER — KETOROLAC TROMETHAMINE 60 MG/2ML IM SOLN
60.0000 mg | Freq: Once | INTRAMUSCULAR | Status: AC
  Administered 2023-12-25: 60 mg via INTRAMUSCULAR
  Filled 2023-12-25: qty 2

## 2023-12-25 NOTE — ED Provider Notes (Signed)
 [place] EMERGENCY DEPARTMENT AT [facility] Provider Note   CSN: [id number] Arrival date & time: 12/24/23  2231     Patient presents with: Flank Pain and Post-op Problem   Janice Ibarra is a 48 y.o. female.   Presents to the emergency department for evaluation of severe right sided flank pain.  Patient had laser lithotripsy and stent placement on September 9 for right ureteral stone.  Patient remove the ureteral stent today as instructed and afterwards started having recurrence of pain.       Prior to Admission medications   Medication Sig Start Date End Date Taking? Authorizing Provider  ciprofloxacin  (CIPRO ) 500 MG tablet Take 1 tablet (500 mg total) by mouth daily. X 3 days to prevent infection with tethered stent 12/21/23   Alvaro Ricardo KATHEE Mickey., MD  cyclobenzaprine (FLEXERIL) 10 MG tablet Take 1 tablet by mouth every evening. 08/28/19   [provider]  ketorolac  (TORADOL ) 10 MG tablet Take 1 tablet (10 mg total) by mouth every 8 (eight) hours as needed for moderate pain (pain score 4-6) (or stent discomfort post-operatively). 12/21/23   Alvaro Ricardo KATHEE Mickey., MD  oxyCODONE -acetaminophen  (PERCOCET) 5-325 MG tablet Take 1 tablet by mouth every 6 (six) hours as needed for severe pain (pain score 7-10) (post-operatively). 12/21/23   Alvaro Ricardo KATHEE Mickey., MD  senna-docusate (SENOKOT-S) 8.6-50 MG tablet Take 1 tablet by mouth 2 (two) times daily. While taking strong pain meds to prevent constipation 12/21/23   Manny, Ricardo KATHEE Mickey., MD  tamsulosin  (FLOMAX ) 0.4 MG CAPS capsule Take 1 capsule (0.4 mg total) by mouth daily. 12/20/23   Geroldine Berg, MD    Allergies: Penicillins and Erythromycin    Review of Systems  Updated Vital Signs BP 99/71   Pulse 66   Temp 98.7 F (37.1 C) (Oral)   Resp 15   Ht 5' 5 (1.651 m)   Wt 74.8 kg   SpO2 100%   BMI 27.44 kg/m   Physical Exam Vitals and nursing note reviewed.  Constitutional:      General: She is in acute  distress.     Appearance: She is well-developed.  HENT:     Head: Normocephalic and atraumatic.     Mouth/Throat:     Mouth: Mucous membranes are moist.  Eyes:     General: Vision grossly intact. Gaze aligned appropriately.     Extraocular Movements: Extraocular movements intact.     Conjunctiva/sclera: Conjunctivae normal.  Cardiovascular:     Rate and Rhythm: Normal rate and regular rhythm.     Pulses: Normal pulses.     Heart sounds: Normal heart sounds, S1 normal and S2 normal. No murmur heard.    No friction rub. No gallop.  Pulmonary:     Effort: Pulmonary effort is normal. No respiratory distress.     Breath sounds: Normal breath sounds.  Abdominal:     General: Bowel sounds are normal.     Palpations: Abdomen is soft.     Tenderness: There is no abdominal tenderness. There is no guarding or rebound.     Hernia: No hernia is present.  Musculoskeletal:        General: No swelling.     Cervical back: Full passive range of motion without pain, normal range of motion and neck supple. No spinous process tenderness or muscular tenderness. Normal range of motion.     Right lower leg: No edema.     Left lower leg: No edema.  Skin:  General: Skin is warm and dry.     Capillary Refill: Capillary refill takes less than 2 seconds.     Findings: No ecchymosis, erythema, rash or wound.  Neurological:     General: No focal deficit present.     Mental Status: She is alert and oriented to person, place, and time.     GCS: GCS eye subscore is 4. GCS verbal subscore is 5. GCS motor subscore is 6.     Cranial Nerves: Cranial nerves 2-12 are intact.     Sensory: Sensation is intact.     Motor: Motor function is intact.     Coordination: Coordination is intact.  Psychiatric:        Attention and Perception: Attention normal.        Mood and Affect: Mood normal.        Speech: Speech normal.        Behavior: Behavior normal.     (all labs ordered are listed, but only abnormal  results are displayed) Labs Reviewed  CBC WITH DIFFERENTIAL/PLATELET - Abnormal; Notable for the following components:      Result Value   WBC 11.8 (*)    Hemoglobin 10.9 (*)    HCT 32.7 (*)    Neutro Abs 9.0 (*)    All other components within normal limits  BASIC METABOLIC PANEL WITH GFR - Abnormal; Notable for the following components:   CO2 21 (*)    Glucose, Bld 110 (*)    Creatinine, Ser 1.16 (*)    GFR, Estimated 58 (*)    All other components within normal limits  URINALYSIS, ROUTINE W REFLEX MICROSCOPIC    EKG: None  Radiology: CT RENAL STONE STUDY Result Date: 12/25/2023 CLINICAL DATA:  History of prior right ureteral stone with stent placement and recent stent removal with recurrent right flank pain, initial encounter EXAM: CT ABDOMEN AND PELVIS WITHOUT CONTRAST TECHNIQUE: Multidetector CT imaging of the abdomen and pelvis was performed following the standard protocol without IV contrast. RADIATION DOSE REDUCTION: This exam was performed according to the departmental dose-optimization program which includes automated exposure control, adjustment of the mA and/or kV according to patient size and/or use of iterative reconstruction technique. COMPARISON:  12/20/2023 FINDINGS: Lower chest: No acute abnormality. Hepatobiliary: No focal liver abnormality is seen. Status post cholecystectomy. No biliary dilatation. Pancreas: Unremarkable. No pancreatic ductal dilatation or surrounding inflammatory changes. Spleen: Normal in size without focal abnormality. Adrenals/Urinary Tract: Adrenal glands are within normal limits. Left kidney is unremarkable. Right kidney demonstrates persistent significant hydronephrosis which extends to the level of the urinary bladder. No discrete stone is identified. These changes may be related to edema from the recent stent. Bladder is partially distended. Stomach/Bowel: No obstructive or inflammatory changes of colon are noted. The appendix is within normal  limits. Small bowel and stomach are unremarkable. Vascular/Lymphatic: No significant vascular findings are present. No enlarged abdominal or pelvic lymph nodes. Reproductive: Status post hysterectomy. No adnexal masses. Other: No abdominal wall hernia or abnormality. No abdominopelvic ascites. Musculoskeletal: No acute or significant osseous findings. IMPRESSION: Stable high-grade hydronephrosis on the right despite prior stone removal and stent removal. This is likely related to distal edema related to the stent as no definitive obstructing stone is seen. No other focal abnormality is noted. Electronically Signed   By: Oneil Devonshire M.D.   On: 12/25/2023 00:55     Procedures   Medications Ordered in the ED  ketorolac  (TORADOL ) injection 60 mg (has no administration in time  range)  HYDROmorphone  (DILAUDID ) injection 1 mg (1 mg Intravenous Given 12/25/23 0000)  ondansetron  (ZOFRAN ) injection 4 mg (4 mg Intravenous Given 12/25/23 0001)                                    Medical Decision Making Amount and/or Complexity of Data Reviewed Labs: ordered. Radiology: ordered.  Risk Prescription drug management.   Differential Diagnosis considered includes, but not limited to: Renal colic/kidney stone; pyelonephritis; aortic dissection; musculoskeletal pain.  Patient with severe right sided renal colic after removing ureteral stent.  CT scan does not show any residual ureteral stones but she has significant right-sided hydronephrosis and hydroureter.  This is likely secondary to ureteral edema after removal of the stent.  Treated with analgesia, Toradol .  Will provide additional analgesia to be used at home, return for uncontrolled pain, follow-up with urology.     Final diagnoses:  Renal colic on right side    ED Discharge Orders     None          Nichael Ehly, Lonni PARAS, MD 12/25/23 [phone]

## 2023-12-28 MED FILL — Oxycodone w/ Acetaminophen Tab 5-325 MG: ORAL | Qty: 6 | Status: AC
